# Patient Record
Sex: Female | Born: 1935 | Race: White | Hispanic: No | Marital: Married | Smoking: Never smoker
Health system: Southern US, Community
[De-identification: ages and names within clinical notes are randomized; demographics above are authoritative.]

## PROBLEM LIST (undated history)

## (undated) DIAGNOSIS — I10 Essential (primary) hypertension: Secondary | ICD-10-CM

## (undated) DIAGNOSIS — J45909 Unspecified asthma, uncomplicated: Secondary | ICD-10-CM

## (undated) DIAGNOSIS — K634 Enteroptosis: Secondary | ICD-10-CM

## (undated) DIAGNOSIS — M1990 Unspecified osteoarthritis, unspecified site: Secondary | ICD-10-CM

## (undated) DIAGNOSIS — I2699 Other pulmonary embolism without acute cor pulmonale: Secondary | ICD-10-CM

## (undated) DIAGNOSIS — J449 Chronic obstructive pulmonary disease, unspecified: Secondary | ICD-10-CM

## (undated) DIAGNOSIS — K921 Melena: Secondary | ICD-10-CM

## (undated) DIAGNOSIS — Z1211 Encounter for screening for malignant neoplasm of colon: Secondary | ICD-10-CM

## (undated) DIAGNOSIS — T7840XA Allergy, unspecified, initial encounter: Secondary | ICD-10-CM

## (undated) DIAGNOSIS — Z1239 Encounter for other screening for malignant neoplasm of breast: Secondary | ICD-10-CM

## (undated) DIAGNOSIS — Z86718 Personal history of other venous thrombosis and embolism: Secondary | ICD-10-CM

## (undated) DIAGNOSIS — Z8719 Personal history of other diseases of the digestive system: Secondary | ICD-10-CM

## (undated) HISTORY — DX: Encounter for other screening for malignant neoplasm of breast: Z12.39

## (undated) HISTORY — DX: Melena: K92.1

## (undated) HISTORY — DX: Encounter for screening for malignant neoplasm of colon: Z12.11

## (undated) HISTORY — DX: Personal history of other venous thrombosis and embolism: Z86.718

## (undated) HISTORY — DX: Allergy, unspecified, initial encounter: T78.40XA

## (undated) HISTORY — DX: Personal history of other diseases of the digestive system: Z87.19

## (undated) HISTORY — DX: Unspecified osteoarthritis, unspecified site: M19.90

## (undated) HISTORY — DX: Essential (primary) hypertension: I10

---
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
## 1963-10-05 HISTORY — PX: OTHER SURGICAL HISTORY: SHX169

## 1966-10-04 HISTORY — PX: ABDOMINAL HYSTERECTOMY: SHX81

## 1966-10-04 HISTORY — PX: TUBAL LIGATION: SHX77

## 1984-10-04 DIAGNOSIS — Z8719 Personal history of other diseases of the digestive system: Secondary | ICD-10-CM

## 1984-10-04 HISTORY — PX: OTHER SURGICAL HISTORY: SHX169

## 1984-10-04 HISTORY — DX: Personal history of other diseases of the digestive system: Z87.19

## 1995-10-05 DIAGNOSIS — I10 Essential (primary) hypertension: Secondary | ICD-10-CM

## 1995-10-05 HISTORY — DX: Essential (primary) hypertension: I10

## 1997-10-04 HISTORY — PX: CHOLECYSTECTOMY: SHX55

## 1998-10-04 HISTORY — PX: COLON SURGERY: SHX602

## 2002-10-04 DIAGNOSIS — Z86718 Personal history of other venous thrombosis and embolism: Secondary | ICD-10-CM

## 2002-10-04 HISTORY — DX: Personal history of other venous thrombosis and embolism: Z86.718

## 2003-11-20 ENCOUNTER — Inpatient Hospital Stay (HOSPITAL_COMMUNITY): Admission: EM | Admit: 2003-11-20 | Discharge: 2003-11-24 | Payer: Self-pay | Admitting: Emergency Medicine

## 2003-12-05 ENCOUNTER — Inpatient Hospital Stay (HOSPITAL_COMMUNITY): Admission: EM | Admit: 2003-12-05 | Discharge: 2003-12-08 | Payer: Self-pay

## 2004-07-22 ENCOUNTER — Ambulatory Visit: Payer: Self-pay | Admitting: Family Medicine

## 2004-07-28 ENCOUNTER — Emergency Department (HOSPITAL_COMMUNITY): Admission: EM | Admit: 2004-07-28 | Discharge: 2004-07-28 | Payer: Self-pay | Admitting: Emergency Medicine

## 2004-12-22 ENCOUNTER — Ambulatory Visit: Payer: Self-pay | Admitting: Podiatry

## 2005-02-22 ENCOUNTER — Ambulatory Visit: Payer: Self-pay | Admitting: Family Medicine

## 2005-03-15 ENCOUNTER — Ambulatory Visit: Payer: Self-pay

## 2005-12-31 ENCOUNTER — Ambulatory Visit: Payer: Self-pay | Admitting: Family Medicine

## 2006-02-02 ENCOUNTER — Emergency Department: Payer: Self-pay | Admitting: Emergency Medicine

## 2006-02-02 ENCOUNTER — Other Ambulatory Visit: Payer: Self-pay

## 2006-02-07 ENCOUNTER — Emergency Department (HOSPITAL_COMMUNITY): Admission: EM | Admit: 2006-02-07 | Discharge: 2006-02-07 | Payer: Self-pay | Admitting: Emergency Medicine

## 2006-02-09 ENCOUNTER — Encounter: Payer: Self-pay | Admitting: Obstetrics and Gynecology

## 2006-02-14 ENCOUNTER — Ambulatory Visit: Payer: Self-pay | Admitting: Family Medicine

## 2006-06-23 ENCOUNTER — Ambulatory Visit: Payer: Self-pay | Admitting: Family Medicine

## 2007-01-16 ENCOUNTER — Other Ambulatory Visit: Payer: Self-pay

## 2007-01-16 ENCOUNTER — Emergency Department: Payer: Self-pay | Admitting: General Practice

## 2007-01-24 ENCOUNTER — Ambulatory Visit: Payer: Self-pay | Admitting: Specialist

## 2008-03-23 ENCOUNTER — Other Ambulatory Visit: Payer: Self-pay

## 2008-03-23 ENCOUNTER — Inpatient Hospital Stay: Payer: Self-pay | Admitting: Internal Medicine

## 2008-05-15 ENCOUNTER — Ambulatory Visit: Payer: Self-pay | Admitting: Family Medicine

## 2008-06-14 ENCOUNTER — Emergency Department: Payer: Self-pay | Admitting: Emergency Medicine

## 2008-06-21 ENCOUNTER — Ambulatory Visit: Payer: Self-pay | Admitting: Internal Medicine

## 2008-06-28 ENCOUNTER — Ambulatory Visit: Payer: Self-pay | Admitting: Rheumatology

## 2008-08-28 ENCOUNTER — Emergency Department: Payer: Self-pay | Admitting: Emergency Medicine

## 2008-09-06 ENCOUNTER — Ambulatory Visit: Payer: Self-pay | Admitting: Rheumatology

## 2008-09-18 ENCOUNTER — Ambulatory Visit: Payer: Self-pay | Admitting: Rheumatology

## 2008-09-18 ENCOUNTER — Emergency Department: Payer: Self-pay | Admitting: Emergency Medicine

## 2008-12-03 ENCOUNTER — Inpatient Hospital Stay: Payer: Self-pay | Admitting: Internal Medicine

## 2009-02-28 ENCOUNTER — Emergency Department: Payer: Self-pay | Admitting: Emergency Medicine

## 2009-03-05 ENCOUNTER — Ambulatory Visit: Payer: Self-pay | Admitting: Family Medicine

## 2009-03-07 ENCOUNTER — Emergency Department: Payer: Self-pay | Admitting: Emergency Medicine

## 2009-03-10 ENCOUNTER — Inpatient Hospital Stay: Payer: Self-pay | Admitting: Internal Medicine

## 2009-05-16 ENCOUNTER — Ambulatory Visit: Payer: Self-pay

## 2009-08-11 ENCOUNTER — Emergency Department: Payer: Self-pay | Admitting: Emergency Medicine

## 2009-08-15 ENCOUNTER — Emergency Department: Payer: Self-pay | Admitting: Emergency Medicine

## 2010-01-02 ENCOUNTER — Ambulatory Visit: Payer: Self-pay | Admitting: Unknown Physician Specialty

## 2010-04-10 ENCOUNTER — Ambulatory Visit: Payer: Self-pay | Admitting: Family Medicine

## 2010-04-14 ENCOUNTER — Emergency Department: Payer: Self-pay | Admitting: Unknown Physician Specialty

## 2010-09-03 ENCOUNTER — Ambulatory Visit: Payer: Self-pay | Admitting: Family Medicine

## 2010-10-04 DIAGNOSIS — T7840XA Allergy, unspecified, initial encounter: Secondary | ICD-10-CM

## 2010-10-04 HISTORY — DX: Allergy, unspecified, initial encounter: T78.40XA

## 2010-12-25 ENCOUNTER — Ambulatory Visit: Payer: Self-pay | Admitting: General Surgery

## 2010-12-25 HISTORY — PX: COLONOSCOPY W/ POLYPECTOMY: SHX1380

## 2010-12-26 ENCOUNTER — Emergency Department: Payer: Self-pay | Admitting: Emergency Medicine

## 2010-12-28 LAB — PATHOLOGY REPORT

## 2011-03-11 ENCOUNTER — Observation Stay: Payer: Self-pay | Admitting: Internal Medicine

## 2011-10-11 ENCOUNTER — Ambulatory Visit: Payer: Self-pay | Admitting: Family Medicine

## 2012-05-15 ENCOUNTER — Ambulatory Visit: Payer: Self-pay | Admitting: Family Medicine

## 2012-05-23 ENCOUNTER — Emergency Department: Payer: Self-pay | Admitting: Emergency Medicine

## 2012-05-23 LAB — CBC
HCT: 36.2 % (ref 35.0–47.0)
HGB: 12.7 g/dL (ref 12.0–16.0)
MCH: 29.5 pg (ref 26.0–34.0)
MCHC: 35 g/dL (ref 32.0–36.0)
MCV: 84 fL (ref 80–100)
Platelet: 207 10*3/uL (ref 150–440)
RBC: 4.29 10*6/uL (ref 3.80–5.20)
RDW: 14.7 % — ABNORMAL HIGH (ref 11.5–14.5)
WBC: 8.7 10*3/uL (ref 3.6–11.0)

## 2012-05-23 LAB — COMPREHENSIVE METABOLIC PANEL
Albumin: 4 g/dL (ref 3.4–5.0)
Alkaline Phosphatase: 88 U/L (ref 50–136)
Anion Gap: 8 (ref 7–16)
BUN: 13 mg/dL (ref 7–18)
Bilirubin,Total: 0.3 mg/dL (ref 0.2–1.0)
Calcium, Total: 9.1 mg/dL (ref 8.5–10.1)
Chloride: 107 mmol/L (ref 98–107)
Co2: 27 mmol/L (ref 21–32)
Creatinine: 0.96 mg/dL (ref 0.60–1.30)
EGFR (African American): >60
EGFR (Non-African Amer.): 57 — ABNORMAL LOW
Glucose: 108 mg/dL — ABNORMAL HIGH (ref 65–99)
Osmolality: 284 (ref 275–301)
Potassium: 4 mmol/L (ref 3.5–5.1)
SGOT(AST): 25 U/L (ref 15–37)
SGPT (ALT): 16 U/L (ref 12–78)
Sodium: 142 mmol/L (ref 136–145)
Total Protein: 7.5 g/dL (ref 6.4–8.2)

## 2012-05-23 LAB — CK TOTAL AND CKMB (NOT AT ARMC)
CK, Total: 59 U/L (ref 21–215)
CK-MB: 0.9 ng/mL (ref 0.5–3.6)

## 2012-05-23 LAB — PRO B NATRIURETIC PEPTIDE: B-Type Natriuretic Peptide: 209 pg/mL (ref 0–450)

## 2012-05-23 LAB — TROPONIN I: Troponin-I: <0.02 ng/mL

## 2012-06-20 ENCOUNTER — Ambulatory Visit: Payer: Self-pay | Admitting: Family Medicine

## 2012-06-21 ENCOUNTER — Ambulatory Visit: Payer: Self-pay | Admitting: Family Medicine

## 2013-04-02 ENCOUNTER — Emergency Department: Payer: Self-pay | Admitting: Emergency Medicine

## 2013-04-02 LAB — CBC
HCT: 37.2 % (ref 35.0–47.0)
HGB: 12.9 g/dL (ref 12.0–16.0)
MCH: 28.6 pg (ref 26.0–34.0)
MCHC: 34.6 g/dL (ref 32.0–36.0)
MCV: 83 fL (ref 80–100)
Platelet: 180 10*3/uL (ref 150–440)
RBC: 4.51 10*6/uL (ref 3.80–5.20)
RDW: 14.7 % — ABNORMAL HIGH (ref 11.5–14.5)
WBC: 8.1 10*3/uL (ref 3.6–11.0)

## 2013-04-02 LAB — COMPREHENSIVE METABOLIC PANEL
Albumin: 4 g/dL (ref 3.4–5.0)
Alkaline Phosphatase: 79 U/L (ref 50–136)
Anion Gap: 7 (ref 7–16)
BUN: 13 mg/dL (ref 7–18)
Bilirubin,Total: 0.5 mg/dL (ref 0.2–1.0)
Calcium, Total: 9.1 mg/dL (ref 8.5–10.1)
Chloride: 107 mmol/L (ref 98–107)
Co2: 27 mmol/L (ref 21–32)
Creatinine: 0.97 mg/dL (ref 0.60–1.30)
EGFR (African American): >60
EGFR (Non-African Amer.): 56 — ABNORMAL LOW
Glucose: 122 mg/dL — ABNORMAL HIGH (ref 65–99)
Osmolality: 283 (ref 275–301)
Potassium: 3.5 mmol/L (ref 3.5–5.1)
SGOT(AST): 19 U/L (ref 15–37)
SGPT (ALT): 17 U/L (ref 12–78)
Sodium: 141 mmol/L (ref 136–145)
Total Protein: 7.5 g/dL (ref 6.4–8.2)

## 2013-04-02 LAB — PRO B NATRIURETIC PEPTIDE: B-Type Natriuretic Peptide: 306 pg/mL (ref 0–450)

## 2013-04-02 LAB — TROPONIN I: Troponin-I: <0.02 ng/mL

## 2013-04-18 ENCOUNTER — Encounter: Payer: Self-pay | Admitting: *Deleted

## 2013-04-18 DIAGNOSIS — Z86718 Personal history of other venous thrombosis and embolism: Secondary | ICD-10-CM | POA: Insufficient documentation

## 2013-07-03 ENCOUNTER — Emergency Department: Payer: Self-pay

## 2013-07-03 LAB — BASIC METABOLIC PANEL
Anion Gap: 5 — ABNORMAL LOW (ref 7–16)
BUN: 15 mg/dL (ref 7–18)
Calcium, Total: 9.8 mg/dL (ref 8.5–10.1)
Chloride: 104 mmol/L (ref 98–107)
Co2: 27 mmol/L (ref 21–32)
Creatinine: 0.84 mg/dL (ref 0.60–1.30)
EGFR (African American): >60
EGFR (Non-African Amer.): >60
Glucose: 105 mg/dL — ABNORMAL HIGH (ref 65–99)
Osmolality: 273 (ref 275–301)
Potassium: 4.2 mmol/L (ref 3.5–5.1)
Sodium: 136 mmol/L (ref 136–145)

## 2013-07-03 LAB — TROPONIN I
Troponin-I: <0.02 ng/mL
Troponin-I: <0.02 ng/mL

## 2013-07-03 LAB — CBC
HCT: 37.4 % (ref 35.0–47.0)
HGB: 13.1 g/dL (ref 12.0–16.0)
MCH: 28.4 pg (ref 26.0–34.0)
MCHC: 34.9 g/dL (ref 32.0–36.0)
MCV: 81 fL (ref 80–100)
Platelet: 217 10*3/uL (ref 150–440)
RBC: 4.59 10*6/uL (ref 3.80–5.20)
RDW: 14.7 % — ABNORMAL HIGH (ref 11.5–14.5)
WBC: 9.7 10*3/uL (ref 3.6–11.0)

## 2013-07-06 ENCOUNTER — Observation Stay: Payer: Self-pay | Admitting: Internal Medicine

## 2013-07-06 LAB — BASIC METABOLIC PANEL
Anion Gap: 9 (ref 7–16)
BUN: 12 mg/dL (ref 7–18)
Calcium, Total: 9 mg/dL (ref 8.5–10.1)
Chloride: 108 mmol/L — ABNORMAL HIGH (ref 98–107)
Co2: 22 mmol/L (ref 21–32)
Creatinine: 0.92 mg/dL (ref 0.60–1.30)
EGFR (African American): >60
EGFR (Non-African Amer.): 60 — ABNORMAL LOW
Glucose: 112 mg/dL — ABNORMAL HIGH (ref 65–99)
Osmolality: 278 (ref 275–301)
Potassium: 3.8 mmol/L (ref 3.5–5.1)
Sodium: 139 mmol/L (ref 136–145)

## 2013-07-06 LAB — CBC WITH DIFFERENTIAL/PLATELET
Basophil #: 0.1 10*3/uL (ref 0.0–0.1)
Basophil %: 0.7 %
Eosinophil #: 0.1 10*3/uL (ref 0.0–0.7)
Eosinophil %: 0.9 %
HCT: 39 % (ref 35.0–47.0)
HGB: 13.5 g/dL (ref 12.0–16.0)
Lymphocyte #: 1.5 10*3/uL (ref 1.0–3.6)
Lymphocyte %: 14.1 %
MCH: 28.5 pg (ref 26.0–34.0)
MCHC: 34.5 g/dL (ref 32.0–36.0)
MCV: 83 fL (ref 80–100)
Monocyte #: 0.6 x10 3/mm (ref 0.2–0.9)
Monocyte %: 5.8 %
Neutrophil #: 8.6 10*3/uL — ABNORMAL HIGH (ref 1.4–6.5)
Neutrophil %: 78.5 %
Platelet: 202 10*3/uL (ref 150–440)
RBC: 4.72 10*6/uL (ref 3.80–5.20)
RDW: 14.4 % (ref 11.5–14.5)
WBC: 10.9 10*3/uL (ref 3.6–11.0)

## 2013-07-07 LAB — CBC WITH DIFFERENTIAL/PLATELET
Basophil #: 0 10*3/uL (ref 0.0–0.1)
Basophil %: 0.2 %
Eosinophil #: 0 10*3/uL (ref 0.0–0.7)
Eosinophil %: 0 %
HCT: 32.8 % — ABNORMAL LOW (ref 35.0–47.0)
HGB: 11.6 g/dL — ABNORMAL LOW (ref 12.0–16.0)
Lymphocyte #: 0.8 10*3/uL — ABNORMAL LOW (ref 1.0–3.6)
Lymphocyte %: 8.6 %
MCH: 29 pg (ref 26.0–34.0)
MCHC: 35.5 g/dL (ref 32.0–36.0)
MCV: 82 fL (ref 80–100)
Monocyte #: 0.1 x10 3/mm — ABNORMAL LOW (ref 0.2–0.9)
Monocyte %: 1.6 %
Neutrophil #: 8.3 10*3/uL — ABNORMAL HIGH (ref 1.4–6.5)
Neutrophil %: 89.6 %
Platelet: 209 10*3/uL (ref 150–440)
RBC: 4.01 10*6/uL (ref 3.80–5.20)
RDW: 14.6 % — ABNORMAL HIGH (ref 11.5–14.5)
WBC: 9.3 10*3/uL (ref 3.6–11.0)

## 2013-07-07 LAB — BASIC METABOLIC PANEL
Anion Gap: 7 (ref 7–16)
BUN: 13 mg/dL (ref 7–18)
Calcium, Total: 9.1 mg/dL (ref 8.5–10.1)
Chloride: 110 mmol/L — ABNORMAL HIGH (ref 98–107)
Co2: 24 mmol/L (ref 21–32)
Creatinine: 0.96 mg/dL (ref 0.60–1.30)
EGFR (African American): >60
EGFR (Non-African Amer.): 57 — ABNORMAL LOW
Glucose: 160 mg/dL — ABNORMAL HIGH (ref 65–99)
Osmolality: 285 (ref 275–301)
Potassium: 3.8 mmol/L (ref 3.5–5.1)
Sodium: 141 mmol/L (ref 136–145)

## 2013-10-02 ENCOUNTER — Encounter: Payer: Self-pay | Admitting: Family Medicine

## 2013-10-04 ENCOUNTER — Encounter: Payer: Self-pay | Admitting: Family Medicine

## 2013-10-29 ENCOUNTER — Ambulatory Visit (INDEPENDENT_AMBULATORY_CARE_PROVIDER_SITE_OTHER): Payer: Medicare HMO | Admitting: Emergency Medicine

## 2013-10-29 ENCOUNTER — Encounter: Payer: Self-pay | Admitting: Emergency Medicine

## 2013-10-29 VITALS — BP 164/90 | HR 73 | Temp 98.2°F | Ht 66.0 in | Wt 150.0 lb

## 2013-10-29 DIAGNOSIS — R0609 Other forms of dyspnea: Secondary | ICD-10-CM

## 2013-10-29 DIAGNOSIS — R0989 Other specified symptoms and signs involving the circulatory and respiratory systems: Secondary | ICD-10-CM

## 2013-10-29 DIAGNOSIS — R0600 Dyspnea, unspecified: Secondary | ICD-10-CM | POA: Insufficient documentation

## 2013-10-29 NOTE — Patient Instructions (Signed)
Walking oximetry today We will perform full pulmonary function testing  Follow with Dr Delton CoombesByrum next available with Full PFT to review

## 2013-10-29 NOTE — Progress Notes (Signed)
Subjective:    Patient ID: Brooke Perez, female    DOB: 09-Jun-1933, 80 y.o.   MRN: [id number]  HPI 80 yo woman, never smoker, hx allergies, HTN, pulmonary embolism dx in '04 at [facility] treated w coumadin, but no longer tolerated. She had a low prob V/q in 2012. She has documented nocturnal hypoxemia. She was given dx of asthma over 20 yrs ago, but office notes from Dr Eliane Decree office suggest decreased FEV1 without obstruction.   She was dx with PNA 9 months ago> bronchitic sx, SOB at all times. She was admitted to [facility] in 10/14 for an apparent bronchitis + dyspnea. Was rx with steroids, ? abx - she isn't sure. She is doing [facility] at [employer]   Her biggest complaint is exertional dyspnea.     Review of Systems  Constitutional: Negative for fever, chills and unexpected weight change.  HENT: Positive for dental problem, sneezing and sore throat. Negative for congestion, ear pain, nosebleeds, postnasal drip, rhinorrhea, sinus pressure, trouble swallowing and voice change.   Eyes: Negative for visual disturbance.  Respiratory: Positive for cough and shortness of breath. Negative for choking.   Cardiovascular: Positive for chest pain. Negative for leg swelling.  Gastrointestinal: Positive for abdominal pain. Negative for vomiting and diarrhea.  Genitourinary: Negative for difficulty urinating.       Indigestion  Musculoskeletal: Positive for arthralgias.  Skin: Negative for rash.  Neurological: Negative for tremors, syncope and headaches.  Hematological: Does not bruise/bleed easily.    Past Medical History  Diagnosis Date  . Allergy 2012    ? benzimidazole, IV dye  . Arthritis     since 1997  . Hernia 2011  . Hypertension 1997    since 1997  . Blood in stool   . Breast screening, unspecified   . Special screening for malignant neoplasms, colon   . History of cholecystitis 1986  . H/O blood clots 2004     Family History  Problem Relation Age of Onset  . Emphysema  Sister     smoked  . Emphysema Sister     smoked  . Asthma Sister   . Asthma Brother   . Heart disease Mother   . Heart disease Maternal Uncle   . Heart disease Brother      History   Social History  . Marital Status: Married    Spouse Name: N/A    Number of Children: 2  . Years of Education: N/A   Occupational History  . 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 Smoking status: Never Smoker   . Smokeless tobacco: Never Used  . Alcohol Use: No  . Drug Use: No  . Sexual Activity: Not on file   Other Topics Concern  . Not on file   Social History Narrative  . No narrative on file     Allergies  Allergen Reactions  . Alprazolam   . Aspirin   . Atenolol   . Atrovent [Ipratropium]   . Ciprofloxacin   . Clonidine Derivatives   . Codeine   . Contrast Media [Iodinated Diagnostic Agents]   . Coumadin [Warfarin Sodium]   . Darvon [Propoxyphene Hcl]   . Demerol [Meperidine]   . Flovent [Fluticasone]   . Guiatuss [Guaifenesin]   . Iohexol   . Keflex [Cephalexin]   . Morphine And Related   . Nitroglycerin   . Parafon Forte Dsc [  Chlorzoxazone]   . Plavix [Clopidogrel Bisulfate]   . Prednisone (Pak) [Prednisone]   . Pyrimethamine   . Sulfa Antibiotics   . Zithromax [Azithromycin]      No outpatient prescriptions prior to visit.   No facility-administered medications prior to visit.          Objective:   Physical Exam Filed Vitals:   10/29/13 1404  BP: 164/90  Pulse: 73  Temp: 98.2 F (36.8 C)  TempSrc: Oral  Height: 5\' 6"  (1.676 m)  Weight: 150 lb (68.04 kg)  SpO2: 95%   Gen: Pleasant, well-nourished, in no distress, strange affect  ENT: No lesions,  mouth clear,  oropharynx clear, no postnasal drip  Neck: No JVD, no TMG, no carotid bruits, some soft insp stridor  Lungs: No use of accessory muscles, clear without rales or rhonchi, no wheezes  Cardiovascular: RRR, heart sounds normal, no  murmur or gallops, no peripheral edema  Musculoskeletal: No deformities, no cyanosis or clubbing  Neuro: alert, non focal  Skin: Warm, no lesions or rashes      Assessment & Plan:  Dyspnea Resting and exertional SOB in pt with hx of remote VTE, possible asthma (she states dx > 20 yrs ago). Etiology is unclear. She is a never smoker. Has not always benefited from albuterol clinically. A V/q in 2012 was low prob. Story sounds like bronchitic, bronchospastic sx. She has [facility]  - full PFT's - walk today - obtained CXR from [facility] - continue pulm rehab - rov to discuss PFT

## 2013-10-29 NOTE — Assessment & Plan Note (Addendum)
Resting and exertional SOB in pt with hx of remote VTE, possible asthma (she states dx > 20 yrs ago). Etiology is unclear. She is a never smoker. Has not always benefited from albuterol clinically. A V/q in 2012 was low prob. Story sounds like bronchitic, bronchospastic sx. She has [facility].  - full PFT's - walk today - obtained CXR from [facility] - continue pulm rehab - rov to discuss PFT

## 2013-11-04 ENCOUNTER — Encounter: Payer: Self-pay | Admitting: Family Medicine

## 2013-11-07 ENCOUNTER — Ambulatory Visit: Payer: Medicare HMO | Admitting: Emergency Medicine

## 2013-11-07 ENCOUNTER — Encounter (HOSPITAL_COMMUNITY): Payer: Medicare HMO

## 2013-12-02 ENCOUNTER — Encounter: Payer: Self-pay | Admitting: Family Medicine

## 2014-01-02 ENCOUNTER — Encounter: Payer: Self-pay | Admitting: Family Medicine

## 2014-01-11 ENCOUNTER — Emergency Department: Payer: Self-pay | Admitting: Emergency Medicine

## 2014-01-11 LAB — CBC
HCT: 40 % (ref 35.0–47.0)
HGB: 12.9 g/dL (ref 12.0–16.0)
MCH: 26.7 pg (ref 26.0–34.0)
MCHC: 32.2 g/dL (ref 32.0–36.0)
MCV: 83 fL (ref 80–100)
Platelet: 213 10*3/uL (ref 150–440)
RBC: 4.82 10*6/uL (ref 3.80–5.20)
RDW: 14.7 % — ABNORMAL HIGH (ref 11.5–14.5)
WBC: 9.3 10*3/uL (ref 3.6–11.0)

## 2014-01-11 LAB — COMPREHENSIVE METABOLIC PANEL
Albumin: 4.2 g/dL (ref 3.4–5.0)
Alkaline Phosphatase: 83 U/L
Anion Gap: 4 — ABNORMAL LOW (ref 7–16)
BUN: 13 mg/dL (ref 7–18)
Bilirubin,Total: 0.4 mg/dL (ref 0.2–1.0)
Calcium, Total: 9.4 mg/dL (ref 8.5–10.1)
Chloride: 106 mmol/L (ref 98–107)
Co2: 28 mmol/L (ref 21–32)
Creatinine: 0.83 mg/dL (ref 0.60–1.30)
EGFR (African American): >60
EGFR (Non-African Amer.): >60
Glucose: 115 mg/dL — ABNORMAL HIGH (ref 65–99)
Osmolality: 277 (ref 275–301)
Potassium: 3.5 mmol/L (ref 3.5–5.1)
SGOT(AST): 25 U/L (ref 15–37)
SGPT (ALT): 19 U/L (ref 12–78)
Sodium: 138 mmol/L (ref 136–145)
Total Protein: 7.7 g/dL (ref 6.4–8.2)

## 2014-01-11 LAB — URINALYSIS, COMPLETE
Bacteria: NONE SEEN
Bilirubin,UR: NEGATIVE
Glucose,UR: NEGATIVE mg/dL (ref 0–75)
Ketone: NEGATIVE
Leukocyte Esterase: NEGATIVE
Nitrite: NEGATIVE
Ph: 7 (ref 4.5–8.0)
Protein: NEGATIVE
RBC,UR: NONE SEEN /HPF (ref 0–5)
Specific Gravity: 1.003 (ref 1.003–1.030)
Squamous Epithelial: <1
WBC UR: <1 /HPF (ref 0–5)

## 2014-01-11 LAB — TROPONIN I: Troponin-I: <0.02 ng/mL

## 2014-01-11 LAB — CK TOTAL AND CKMB (NOT AT ARMC)
CK, Total: 44 U/L
CK-MB: 0.9 ng/mL (ref 0.5–3.6)

## 2014-02-01 ENCOUNTER — Encounter: Payer: Self-pay | Admitting: Family Medicine

## 2014-03-04 ENCOUNTER — Encounter: Payer: Self-pay | Admitting: Family Medicine

## 2014-04-17 ENCOUNTER — Ambulatory Visit (INDEPENDENT_AMBULATORY_CARE_PROVIDER_SITE_OTHER): Payer: Medicare HMO | Admitting: Emergency Medicine

## 2014-04-17 ENCOUNTER — Ambulatory Visit (INDEPENDENT_AMBULATORY_CARE_PROVIDER_SITE_OTHER)
Admission: RE | Admit: 2014-04-17 | Discharge: 2014-04-17 | Disposition: A | Discharge status: Home / Self Care | Payer: Medicare HMO | Source: Ambulatory Visit | Attending: Emergency Medicine | Admitting: Emergency Medicine

## 2014-04-17 ENCOUNTER — Encounter: Payer: Self-pay | Admitting: Emergency Medicine

## 2014-04-17 VITALS — BP 130/86 | HR 82 | Ht 66.0 in | Wt 150.6 lb

## 2014-04-17 DIAGNOSIS — R0600 Dyspnea, unspecified: Secondary | ICD-10-CM

## 2014-04-17 DIAGNOSIS — R0989 Other specified symptoms and signs involving the circulatory and respiratory systems: Secondary | ICD-10-CM

## 2014-04-17 DIAGNOSIS — R0609 Other forms of dyspnea: Secondary | ICD-10-CM

## 2014-04-17 MED ORDER — FLUTICASONE FUROATE-VILANTEROL 100-25 MCG/INH IN AEPB: 1.0000 | INHALATION_SPRAY | Freq: Every day | RESPIRATORY_TRACT | Status: DC

## 2014-04-17 MED ORDER — HYDROCOD POLST-CHLORPHEN POLST 10-8 MG/5ML PO LQCR: 5.0000 mL | Freq: Two times a day (BID) | ORAL | Status: DC | PRN

## 2014-04-17 NOTE — Assessment & Plan Note (Addendum)
Spirometry today > consistent with mild to moderate AFL - trial breo - tussionex for cough if she can tolerate - rov 6 weeks

## 2014-04-17 NOTE — Patient Instructions (Addendum)
We will repeat your CXR today Spirometry breathing testing today shows evidence for mild to moderate asthma.  We will start Breo once a day for the next 6 weeks Follow with Dr Delton CoombesByrum in 6 weeks to review your symptoms on the new medication.  Use tusionex 5cc every 12 hours if needed for cough Follow with Dr Delton CoombesByrum in 6 weeks or sooner if you have any problems

## 2014-04-17 NOTE — Progress Notes (Signed)
Subjective:    Patient ID: Brooke Perez, female    DOB: July 02, 1933, 80 y.o.   MRN: [id number]  Shortness of Breath Associated symptoms include abdominal pain, chest pain and a sore throat. Pertinent negatives include no ear pain, fever, headaches, leg swelling, rash, rhinorrhea or vomiting.   80 yo woman, never smoker, hx allergies, HTN, pulmonary embolism dx in '04 at [facility] treated w coumadin, but no longer tolerated. She had a low prob V/q in 2012. She has documented nocturnal hypoxemia. She was given dx of asthma over 20 yrs ago, but office notes from Dr Eliane Decree office suggest decreased FEV1 without obstruction.   She was dx with PNA 9 months ago> bronchitic sx, SOB at all times. She was admitted to [facility] in 10/14 for an apparent bronchitis + dyspnea. Was rx with steroids, ? abx - she isn't sure. She is doing [facility] at [employer]   Her biggest complaint is exertional dyspnea.   ROV 04/17/14 -- follow up for exertional SOB as well as cough. She has been unable to get her full PFT yet due to transportation issues. No desaturation after 2 laps last visit on RA. She describes today cough, sometimes productive. She has been using albuterol on a schedule since she was admitted to [facility] 10/'14. She is having LE edema. Hoarse voice, nasal gtt and congestion.      Review of Systems  Constitutional: Negative for fever, chills and unexpected weight change.  HENT: Positive for dental problem, sneezing and sore throat. Negative for congestion, ear pain, nosebleeds, postnasal drip, rhinorrhea, sinus pressure, trouble swallowing and voice change.   Eyes: Negative for visual disturbance.  Respiratory: Positive for cough and shortness of breath. Negative for choking.   Cardiovascular: Positive for chest pain. Negative for leg swelling.  Gastrointestinal: Positive for abdominal pain. Negative for vomiting and diarrhea.  Genitourinary: Negative for difficulty urinating.       Indigestion   Musculoskeletal: Positive for arthralgias.  Skin: Negative for rash.  Neurological: Negative for tremors, syncope and headaches.  Hematological: Does not bruise/bleed easily.     Family History  Problem Relation Age of Onset  . Emphysema Sister     smoked  . Emphysema Sister     smoked  . Asthma Sister   . Asthma Brother   . Heart disease Mother   . Heart disease Maternal Uncle   . Heart disease Brother      History   Social History  . Marital Status: Married    Spouse Name: N/A    Number of Children: 2  . Years of Education: N/A   Occupational History  . 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 Smoking status: Never Smoker   . Smokeless tobacco: Never Used  . Alcohol Use: No  . Drug Use: No  . Sexual Activity: Not on file   Other Topics Concern  . Not on file   Social History Narrative  . No narrative on file     Allergies  Allergen Reactions  . Alprazolam   . Aspirin   . Atenolol   . Atrovent [Ipratropium]   . Ciprofloxacin   . Clonidine Derivatives   . Codeine   . Contrast Media [Iodinated Diagnostic Agents]   . Coumadin [Warfarin Sodium]   . Darvon [Propoxyphene Hcl]   . Demerol [Meperidine]   . Flovent [Fluticasone]   . Guiatuss [Guaifenesin]   .  Iohexol   . Keflex [Cephalexin]   . Morphine And Related   . Nitroglycerin   . Parafon Forte Dsc [Chlorzoxazone]   . Plavix [Clopidogrel Bisulfate]   . Prednisone (Pak) [Prednisone]   . Pyrimethamine   . Sulfa Antibiotics   . Zithromax [Azithromycin]      Outpatient Prescriptions Prior to Visit  Medication Sig Dispense Refill  . albuterol (PROVENTIL) (2.5 MG/3ML) 0.083% nebulizer solution Take 2.5 mg by nebulization every 6 (six) hours as needed for wheezing or shortness of breath.      Marland Kitchen. albuterol (VENTOLIN HFA) 108 (90 BASE) MCG/ACT inhaler Inhale 2 puffs into the lungs every 6 (six) hours as needed for wheezing or shortness of breath.       . levothyroxine (SYNTHROID, LEVOTHROID) 75 MCG tablet Take 75 mcg by mouth daily before breakfast.      . Polyethylene Glycol 3350 (MIRALAX PO) Take 1 Package by mouth daily.       No facility-administered medications prior to visit.          Objective:   Physical Exam Filed Vitals:   04/17/14 1325  BP: 130/86  Pulse: 82  Height: 5\' 6"  (1.676 m)  Weight: 150 lb 9.6 oz (68.312 kg)  SpO2: 98%   Gen: Pleasant, well-nourished, in no distress, strange affect, occasionally coughing  ENT: No lesions,  mouth clear,  oropharynx clear, no postnasal drip, hoarse voice  Neck: No JVD, no TMG, no carotid bruits, some soft insp stridor  Lungs: No use of accessory muscles, clear without rales or rhonchi, no wheezes  Cardiovascular: RRR, heart sounds normal, no murmur or gallops, trace peripheral edema  Musculoskeletal: No deformities, no cyanosis or clubbing  Neuro: alert, non focal  Skin: Warm, no lesions or rashes      Assessment & Plan:  Dyspnea Spirometry today > consistent with mild to moderate AFL - trial breo - tussionex for cough if she can tolerate - rov 6 weeks

## 2014-05-29 ENCOUNTER — Ambulatory Visit: Payer: Medicare HMO | Admitting: Emergency Medicine

## 2014-08-05 ENCOUNTER — Encounter: Payer: Self-pay | Admitting: Emergency Medicine

## 2014-08-08 ENCOUNTER — Ambulatory Visit: Payer: Self-pay | Admitting: Family Medicine

## 2014-09-09 DIAGNOSIS — N993 Prolapse of vaginal vault after hysterectomy: Secondary | ICD-10-CM | POA: Insufficient documentation

## 2014-10-30 DIAGNOSIS — N1330 Unspecified hydronephrosis: Secondary | ICD-10-CM | POA: Insufficient documentation

## 2014-10-30 DIAGNOSIS — R339 Retention of urine, unspecified: Secondary | ICD-10-CM | POA: Insufficient documentation

## 2015-01-24 NOTE — H&P (Signed)
PATIENT NAME:  Brooke Perez, [name]:  [id number] DATE OF BIRTH:  [birth year]  DATE OF ADMISSION:  07/06/2013  PRIMARY CARE PROVIDER: Hillery AldoSarah Heavyn Yearsley, MD  ED REFERRING PHYSICIAN: Chaney Mallingavid Yao, MD  CHIEF COMPLAINT: Shortness of breath.   HISTORY OF PRESENT ILLNESS: The patient is a 81 year old with history of COPD, asthma and chronic bronchitis who has had progressive shortness of breath for the past few weeks. She has been treated with outpatient regimen with antibiotics however has not improved. Was seen by her primary care provider and she states that prior to going to her office. She was so severely short of breath that she almost collapsed. She was seen there and then referred to the ED for further evaluation. She reports that she has had chills, has had productive cough and significant amount of wheezing. She is taking oxygen only at nighttime. She otherwise reports cough which is nonproductive and has not had any fevers or chills. Denies any abdominal pain, nausea, vomiting or diarrhea. Denies any urinary frequency, urgency or hesitancy.   PAST MEDICAL HISTORY:  1.  Pneumonia in the past.  2.  Dermatitis.  3.  UTI. 4.  Anxiety and depression.  5.  Uterovaginal prolapse.  6.  Bilateral decreased hearing.  7.  COPD. 8.  Recurrent bladder spasm.  9.  Multiple drug allergies.  10.  Lactose intolerance.  11.  Previous history of pulmonary embolism.  12.  GERD. 13.  Hyperlipidemia.  14.  Hypothyroidism.  15.  Hypertension.   CURRENT MEDICATIONS:  1.  Ventolin 2 puffs q. 4 as needed. 2.  Albuterol neb q. 4 hours as needed. 3.  Norvasc 5 mg daily. 4.  Ampicillin 500 mg 1 tab p.o. b.i.d. 5.  MiraLax 17 grams 3 times a week. 6.  Synthroid 75 mcg daily. 7.  Ventolin 2 puffs q. 4 p.r.n.   SOCIAL HISTORY: Denies any smoking, alcohol or drug use.   ALLERGIES: TO MULTIPLE MEDICATIONS. ACCORDING TO HER, ALLERGIC REACTION TO THESE ALL WERE CRITICAL. THESE INCLUDE ALPRAZOLAM, ASPIRIN, ATENOLOL,  ATROVENT, CIPRO, CLONIDINE, CODEINE, CONTACT COLD, CORICIDIN COUGH DROPS, COUMADIN, DARVON, DEMER ceclor, eryc, GUIATUSS-DM, PARAFON FORTE, PLAVIX, PREDNISONE, PYRIDIUM, SULFA DRUGS AND ZITHROMAX.   SOCIAL HISTORY: Does not smoke. Does not drink. No drugs.   REVIEW OF SYSTEMS:   CONSTITUTIONAL: Denies any fevers. Complains of fatigue, weakness. No weight loss. No weight gain.  EYES: No blurred or double vision. No pain. No redness. No inflammation. No glaucoma or cataracts.  ENT: No tinnitus. No ear pain. Does have apparently bilateral hearing loss. No difficulty swallowing.  RESPIRATORY: Complains of cough, wheezing. No hemoptysis. Has COPD. CARDIOVASCULAR: Complains of bilateral chest pain with coughing. No orthopnea. No edema. No arrhythmia. Complains of dyspnea on exertion.  GASTROINTESTINAL: Complains of dry heaves currently. She states this is due to her reflux. Has a history of GERD. No IBS. No jaundice. No rectal bleeding.  GENITOURINARY: Denies any dysuria, hematuria, renal calculus or frequency.  ENDOCRINE: Denies any polyuria or nocturia. Does have hypothyroidism.  HEMATOLOGIC AND LYMPHATIC: Denies anemia, easy bruisability or bleeding.  SKIN: No acne. No rash. No changes in mole, hair or skin.  MUSCULOSKELETAL: Denies any pain in the neck, back or shoulder.  NEUROLOGIC: No numbness. No CVA, TIA or seizures.  PSYCHIATRIC: Does have a history of anxiety.   PHYSICAL EXAMINATION: VITAL SIGNS: Temperature 97.8, pulse 79, respirations 24, blood pressure 137/75, O2 95% on 2 liters.  GENERAL: The patient is a frail Caucasian female in mild respiratory distress.  HEENT: Head atraumatic, normocephalic. Pupils equally round and reactive to light and accommodation. There is no conjunctival pallor. No scleral icterus. Nasal exam shows no drainage or ulceration. Oropharynx is clear without any exudate.  NECK: Supple without any JVD. HEART: Regular rate and rhythm. No murmurs, rubs, clicks or  gallops.  LUNGS: She has bilateral wheezing throughout both lungs. There is some accessory muscle usage.  ABDOMEN: Soft, nontender, nondistended. Positive bowel sounds x 4. No hepatosplenomegaly.  EXTREMITIES: No clubbing, cyanosis or edema.  SKIN: No rash.  LYMPHATICS: No lymph nodes palpable. VASCULAR: Good DP and PT pulses.  PSYCHIATRIC: Is a little anxious.  NEUROLOGIC: Focal deficits.  LYMPHATICS: No lymph nodes palpable.   EVALUATIONS: WBC 10.9, hemoglobin 13.5, platelet count 202. Glucose 112, BUN 12, creatinine 0.92, sodium 139, potassium 3.8, chloride 108, CO2 22.  PA and lateral chest x-ray shows findings consistent with COPD.   Troponin less than 0.02. WBC 9.7, hemoglobin 13.1, platelet count 207.   ASSESSMENT AND PLAN: The patient is a 81 year old with history of chronic obstructive pulmonary disease, asthma and chronic bronchitis who presents with progressive shortness of breath, cough and abnormal d-dimer in primary care's office.  1.  Acute respiratory failure, likely due to chronic obstructive pulmonary disease/asthma exacerbation. The patient has been given IV methylprednisolone in the ED and has done okay, which I will continue. We will also place the patient on p.o. amoxicillin. Unable to take azithromycin, Levaquin or ceftriaxone. We will also give her nebulizers, only albuterol since her Atrovent allergy. Will also get a VQ scan in light of elevated d-dimer.  3.  Gastroesophageal reflux disease. Will continue lansoprazole. 4.  Hypothyroidism. Continue Synthroid.  5.  Hypertension. Continue Norvasc as taking at home.  6.  Miscellaneous. We will do Lovenox for deep vein thrombosis prophylaxis.  TIME SPENT: 45 minutes. ____________________________ Lacie Scotts Allena Katz, MD shp:sb D: 07/06/2013 15:01:59 ET T: 07/06/2013 15:15:18 ET JOB#: [id number]  cc: Fortunata Betty H. Allena Katz, MD, <Dictator> Charise Carwin MD ELECTRONICALLY SIGNED 07/20/2013 18:26

## 2015-01-24 NOTE — Discharge Summary (Signed)
PATIENT NAME:  Brooke Perez, Renate MR#:  [id number] DATE OF BIRTH:  23-Feb-1933  DATE OF ADMISSION:  07/06/2013 DATE OF DISCHARGE:  07/09/2013  DISCHARGE DIAGNOSES:  1.  Chronic obstructive pulmonary disease exacerbation. 2.  Gastroesophageal reflux disease. 3.  Hypothyroidism.  4.  Hypertension.  5.  Anxiety. 6.  MULTIPLE DRUG ALLERGIES.  DISCHARGE MEDICATIONS: Albuterol nebulizer every 4 hours as needed for wheezing, Synthroid 75 mcg p.o. daily, Xanax as needed for constipation, amlodipine 2.5 mg p.o. daily, ampicillin 250 mg t.i.d. for 10 days.   ALLERGIES: THE PATIENT'S ALLERGY LIST IS IN THE COMPUTER; PLEASE REFER TO IT.  THE PATIENT HAS ALLERGIES  TO ASPIRIN, CODEINE, COUMADIN, DARVON, CIPRO, KEFLEX, ATIVAN, PYRIDIUM, CLARITIN,  ZITHROMAX, COUGH DROPS, CORTISONE, STEROIDS, PREDNISONE, PLAVIX, CLONIDINE, , NITROGLYCERIN, CONTAC COLD, AND SHE IS ALLERGIC TO  GUAIFENESIN.  CONSULTATIONS: None.   HOSPITAL COURSE: The patient is a 81 year old female patient with a history of anxiety and history of asthma, came in because of a shortness of breath and productive phlegm. The patient was given amoxicillin and inhalers, with no relief. Came to emergency room because of pain symptoms. The patient's lab data on admission nonsignificant for any abnormality on CBC and Chem-7. Troponins have been negative.she hs multiple drug allergies A chest x-ray showed old COPD, with no pneumonia. The patient has some fibrosis. The patient received . Ampicillin was given, and she was also started on Solu-Medrol. Following this, she im\roved a lot. Her symptoms really got better. She also had a V/Q scan which did not show any pulmonary emboli. The patient really wanted to go home.  Her lungs are clear today. She says she cannot take p.o. prednisone because of severe throat swelling and feels like she is going to choke, so I did not give any prednisone. While she was here, she received Solu-Medrol 40 mg every 8 hours from  October 3 to 5, . The patient's Solu-Medrol changed to 40 every 12, yesterday so she did get tapering  and I did not discharge her with p.o. prednisone because of her allergic reaction history. The patient discharged only with amoxicillin and also albuterol inhalers.   For her blood pressure, she is on amlodipine. Oxygen saturation at rest on room air 94%, blood pressure 174/90 at the time of discharge, pulse 97, temperature 97.4.    PRIMARY CARE PHYSICIAN:  Dr. Hillery AldoSarah Patel and also she wanted a pulmonary rehab consult, so we have arranged for outpatient pulmonary rehab.   TIME SPENT ON DISCHARGE PREPARATION: More than 30 minutes.    ____________________________ Katha HammingSnehalatha Elijah Michaelis, MD sk:cg D: 07/09/2013 21:43:28 ET T: 07/10/2013 00:08:33 ET JOB#: [id number]  cc: Katha HammingSnehalatha Elie Leppo, MD, <Dictator> Katha HammingSNEHALATHA Brier Reid MD ELECTRONICALLY SIGNED 07/31/2013 23:20

## 2015-07-22 ENCOUNTER — Ambulatory Visit
Admission: RE | Admit: 2015-07-22 | Discharge: 2015-07-22 | Disposition: A | Discharge status: Home / Self Care | Payer: Medicare HMO | Source: Ambulatory Visit | Attending: Family Medicine | Admitting: Family Medicine

## 2015-07-22 ENCOUNTER — Other Ambulatory Visit: Payer: Self-pay | Admitting: Family Medicine

## 2015-07-22 DIAGNOSIS — J449 Chronic obstructive pulmonary disease, unspecified: Secondary | ICD-10-CM

## 2015-07-22 DIAGNOSIS — R1907 Generalized intra-abdominal and pelvic swelling, mass and lump: Secondary | ICD-10-CM

## 2015-09-05 ENCOUNTER — Emergency Department: Payer: Medicare HMO

## 2015-09-05 ENCOUNTER — Emergency Department
Admission: EM | Admit: 2015-09-05 | Discharge: 2015-09-05 | Disposition: A | Discharge status: Home / Self Care | Payer: Medicare HMO | Attending: Emergency Medicine | Admitting: Emergency Medicine

## 2015-09-05 DIAGNOSIS — Z79899 Other long term (current) drug therapy: Secondary | ICD-10-CM | POA: Diagnosis not present

## 2015-09-05 DIAGNOSIS — R1084 Generalized abdominal pain: Secondary | ICD-10-CM | POA: Insufficient documentation

## 2015-09-05 DIAGNOSIS — G8929 Other chronic pain: Secondary | ICD-10-CM | POA: Diagnosis not present

## 2015-09-05 DIAGNOSIS — R0602 Shortness of breath: Secondary | ICD-10-CM | POA: Diagnosis present

## 2015-09-05 DIAGNOSIS — J441 Chronic obstructive pulmonary disease with (acute) exacerbation: Secondary | ICD-10-CM | POA: Diagnosis not present

## 2015-09-05 DIAGNOSIS — N814 Uterovaginal prolapse, unspecified: Secondary | ICD-10-CM | POA: Diagnosis not present

## 2015-09-05 DIAGNOSIS — I10 Essential (primary) hypertension: Secondary | ICD-10-CM | POA: Diagnosis not present

## 2015-09-05 DIAGNOSIS — Z7951 Long term (current) use of inhaled steroids: Secondary | ICD-10-CM | POA: Insufficient documentation

## 2015-09-05 LAB — CBC WITH DIFFERENTIAL/PLATELET
BASOS ABS: 0.1 10*3/uL (ref 0–0.1)
Basophils Relative: 1 %
EOS PCT: 4 %
Eosinophils Absolute: 0.3 10*3/uL (ref 0–0.7)
HEMATOCRIT: 39 % (ref 35.0–47.0)
Hemoglobin: 12.9 g/dL (ref 12.0–16.0)
LYMPHS ABS: 2.1 10*3/uL (ref 1.0–3.6)
LYMPHS PCT: 22 %
MCH: 27.5 pg (ref 26.0–34.0)
MCHC: 33.1 g/dL (ref 32.0–36.0)
MCV: 83 fL (ref 80.0–100.0)
MONO ABS: 0.7 10*3/uL (ref 0.2–0.9)
MONOS PCT: 8 %
Neutro Abs: 6.3 10*3/uL (ref 1.4–6.5)
Neutrophils Relative %: 65 %
PLATELETS: 215 10*3/uL (ref 150–440)
RBC: 4.7 MIL/uL (ref 3.80–5.20)
RDW: 14.8 % — AB (ref 11.5–14.5)
WBC: 9.6 10*3/uL (ref 3.6–11.0)

## 2015-09-05 LAB — BASIC METABOLIC PANEL
Anion gap: 9 (ref 5–15)
BUN: 12 mg/dL (ref 6–20)
CALCIUM: 9.6 mg/dL (ref 8.9–10.3)
CO2: 28 mmol/L (ref 22–32)
CREATININE: 0.93 mg/dL (ref 0.44–1.00)
Chloride: 106 mmol/L (ref 101–111)
GFR calc Af Amer: >60 mL/min (ref >60)
GFR, EST NON AFRICAN AMERICAN: 57 mL/min — AB (ref >60)
GLUCOSE: 108 mg/dL — AB (ref 65–99)
Potassium: 4 mmol/L (ref 3.5–5.1)
Sodium: 143 mmol/L (ref 135–145)

## 2015-09-05 LAB — TROPONIN I: Troponin I: <0.03 ng/mL (ref <0.031)

## 2015-09-05 MED ORDER — ALBUTEROL SULFATE (2.5 MG/3ML) 0.083% IN NEBU
7.5000 mg | INHALATION_SOLUTION | Freq: Once | RESPIRATORY_TRACT | Status: AC
  Administered 2015-09-05: 7.5 mg via RESPIRATORY_TRACT
  Filled 2015-09-05: qty 9

## 2015-09-05 MED ORDER — AMPICILLIN 250 MG PO CAPS: 250.0000 mg | ORAL_CAPSULE | Freq: Four times a day (QID) | ORAL | Status: DC

## 2015-09-05 MED ORDER — AMPICILLIN 250 MG PO CAPS: 250.0000 mg | ORAL_CAPSULE | Freq: Three times a day (TID) | ORAL | Status: AC

## 2015-09-05 MED ORDER — METHYLPREDNISOLONE SODIUM SUCC 125 MG IJ SOLR
125.0000 mg | Freq: Once | INTRAMUSCULAR | Status: AC
  Administered 2015-09-05: 125 mg via INTRAVENOUS
  Filled 2015-09-05: qty 2

## 2015-09-05 NOTE — ED Notes (Signed)
Walked Pt down hallway, O2 sats stayed within normal limits, pt states she felt SOB. EDP made aware

## 2015-09-05 NOTE — ED Notes (Signed)
Dr quale at bedside. 

## 2015-09-05 NOTE — ED Notes (Signed)
According to EMS, pt c/o of SOB, dry non-productive cough that has worsened over 7 days. Pt was seen by [facility] earlier today and was referred to be seen here due to worsening symptoms and numerous allergies. Pt has hx of COPD and PE.  EMS Vitals  98% on 3L 189/109 BP  78 HR

## 2015-09-05 NOTE — Discharge Instructions (Signed)
We believe that your symptoms are caused today by an exacerbation of your COPD, and possibly bronchitis.  Please take the prescribed medications and any medications that you have at home for your COPD.  Follow up with your doctor as recommended.  If you develop any new or worsening symptoms, including but not limited to fever, persistent vomiting, worsening shortness of breath, or other symptoms that concern you, please return to the Emergency Department immediately. ° ° °Chronic Obstructive Pulmonary Disease Exacerbation °Chronic obstructive pulmonary disease (COPD) is a common lung condition in which airflow from the lungs is limited. COPD is a general term that can be used to describe many different lung problems that limit airflow, including chronic bronchitis and emphysema. COPD exacerbations are episodes when breathing symptoms become much worse and require extra treatment. Without treatment, COPD exacerbations can be life threatening, and frequent COPD exacerbations can cause further damage to your lungs. °CAUSES °· Respiratory infections. °· Exposure to smoke. °· Exposure to air pollution, chemical fumes, or dust. °Sometimes there is no apparent cause or trigger. °RISK FACTORS °· Smoking cigarettes. °· Older age. °· Frequent prior COPD exacerbations. °SIGNS AND SYMPTOMS °· Increased coughing. °· Increased thick spit (sputum) production. °· Increased wheezing. °· Increased shortness of breath. °· Rapid breathing. °· Chest tightness. °DIAGNOSIS °Your medical history, a physical exam, and tests will help your health care provider make a diagnosis. Tests may include: °· A chest X-ray. °· Basic lab tests. °· Sputum testing. °· An arterial blood gas test. °TREATMENT °Depending on the severity of your COPD exacerbation, you may need to be admitted to a hospital for treatment. Some of the treatments commonly used to treat COPD exacerbations are:  °· Antibiotic medicines. °· Bronchodilators. These are drugs that  expand the air passages. They may be given with an inhaler or nebulizer. Spacer devices may be needed to help improve drug delivery. °· Corticosteroid medicines. °· Supplemental oxygen therapy. °· Airway clearing techniques, such as noninvasive ventilation (NIV) and positive expiratory pressure (PEP). These provide respiratory support through a mask or other noninvasive device. °HOME CARE INSTRUCTIONS °· Do not smoke. Quitting smoking is very important to prevent COPD from getting worse and exacerbations from happening as often. °· Avoid exposure to all substances that irritate the airway, especially to tobacco smoke. °· If you were prescribed an antibiotic medicine, finish it all even if you start to feel better. °· Take all medicines as directed by your health care provider. It is important to use correct technique with inhaled medicines. °· Drink enough fluids to keep your urine clear or pale yellow (unless you have a medical condition that requires fluid restriction). °· Use a cool mist vaporizer. This makes it easier to clear your chest when you cough. °· If you have a home nebulizer and oxygen, continue to use them as directed. °· Maintain all necessary vaccinations to prevent infections. °· Exercise regularly. °· Eat a healthy diet. °· Keep all follow-up appointments as directed by your health care provider. °SEEK IMMEDIATE MEDICAL CARE IF: °· You have worsening shortness of breath. °· You have trouble talking. °· You have severe chest pain. °· You have blood in your sputum. °· You have a fever. °· You have weakness, vomit repeatedly, or faint. °· You feel confused. °· You continue to get worse. °MAKE SURE YOU: °· Understand these instructions. °· Will watch your condition. °· Will get help right away if you are not doing well or get worse. °  °This information is not   intended to replace advice given to you by your health care provider. Make sure you discuss any questions you have with your health care  provider. °  °Document Released: 07/18/2007 Document Revised: 10/11/2014 Document Reviewed: 05/25/2013 °Elsevier Interactive Patient Education ©2016 [employer] ° °

## 2015-09-05 NOTE — ED Provider Notes (Signed)
[facility] Emergency Department Provider Note  ____________________________________________  Time seen: Seen upon arrival to the emergency department  I have reviewed the triage vital signs and the nursing notes.   HISTORY  Chief Complaint Shortness of Breath    HPI Brooke ArchBarbara A Perez is a 81 y.o. female with a history of COPD who is presenting today with 1 week of worsening cough, fever night and throat pain. She denies any known sick contacts with that she had multiple exposures to people over Thanksgiving and is concerned she may have picked something up then. She has been using albuterol at home but without any relief. She was [facility] today and sent to the emergency department for further evaluation for severe dyspnea after walking only a few steps. The patient is on when necessary oxygen at home. Patient is a history of PE but says she is no longer on medications for this.She also says that she is multiple allergies including to by mouth steroids but has tolerated IV steroids in the past with close supervision. She also cannot tolerate Atrovent. She says that her allergies are her throat closing up.   Past Medical History  Diagnosis Date  . Allergy 2012    ? benzimidazole, IV dye  . Arthritis     since 1997  . Hernia 2011  . Hypertension 1997    since 1997  . Blood in stool   . Breast screening, unspecified   . Special screening for malignant neoplasms, colon   . History of cholecystitis 1986  . H/O blood clots 2004    Patient Active Problem List   Diagnosis Date Noted  . Dyspnea 10/29/2013  . H/O blood clots     Past Surgical History  Procedure Laterality Date  . Broken hand Right 1986  . Tonsillectomy  1954  . Goiter removal  1965  . Abdominal hysterectomy  1968  . Tubal ligation  1968  . Cholecystectomy  1999    h/o cholecystitis in 1986  . Colon surgery  2000  . Colonoscopy w/ polypectomy   12/25/2010    one 5mm semisessile polyp was found in the descending colon    Current Outpatient Rx  Name  Route  Sig  Dispense  Refill  . albuterol (PROVENTIL) (2.5 MG/3ML) 0.083% nebulizer solution   Nebulization   Take 2.5 mg by nebulization every 6 (six) hours as needed for wheezing or shortness of breath.         Marland Kitchen. albuterol (VENTOLIN HFA) 108 (90 BASE) MCG/ACT inhaler   Inhalation   Inhale 2 puffs into the lungs every 6 (six) hours as needed for wheezing or shortness of breath.         Marland Kitchen. amLODipine (NORVASC) 5 MG tablet   Oral   Take 5 mg by mouth daily.         . chlorpheniramine-HYDROcodone (TUSSIONEX PENNKINETIC ER) 10-8 MG/5ML LQCR   Oral   Take 5 mLs by mouth every 12 (twelve) hours as needed for cough.   120 mL   0   . Fluticasone Furoate-Vilanterol (BREO ELLIPTA) 100-25 MCG/INH AEPB   Inhalation   Inhale 1 puff into the lungs daily.   1 each   6   . levothyroxine (SYNTHROID, LEVOTHROID) 75 MCG tablet   Oral   Take 75 mcg by mouth daily before breakfast.         . Polyethylene Glycol 3350 (MIRALAX PO)   Oral  Take 1 Package by mouth daily.           Allergies Alprazolam; Aspirin; Atenolol; Atrovent; Ciprofloxacin; Clonidine derivatives; Codeine; Contrast media; Coumadin; Darvon; Demerol; Flovent; Guiatuss; Iohexol; Keflex; Morphine and related; Nitroglycerin; Parafon forte dsc; Plavix; Prednisone (pak); Pyrimethamine; Sulfa antibiotics; and Zithromax  Family History  Problem Relation Age of Onset  . Emphysema Sister     smoked  . Emphysema Sister     smoked  . Asthma Sister   . Asthma Brother   . Heart disease Mother   . Heart disease Maternal Uncle   . Heart disease Brother     Social History Social History  Substance Use Topics  . Smoking status: Never Smoker   . Smokeless tobacco: Never Used  . Alcohol Use: No    Review of Systems Constitutional: No fever/chills Eyes: No visual changes. ENT: No sore throat. Cardiovascular:  Denies chest pain. Respiratory: As above Gastrointestinal: Chronic diffuse abdominal pain secondary to uterine prolapse. She says the pain is unchanged at this time. No nausea, no vomiting.  No diarrhea.  No constipation. Genitourinary: Negative for dysuria. Musculoskeletal: Negative for back pain. Skin: Negative for rash. Neurological: Negative for headaches, focal weakness or numbness.  10-point ROS otherwise negative.  ____________________________________________   PHYSICAL EXAM:  VITAL SIGNS: ED Triage Vitals  Enc Vitals Group     BP 09/05/15 1254 167/95 mmHg     Pulse Rate 09/05/15 1254 74     Resp --      Temp 09/05/15 1254 97.7 F (36.5 C)     Temp Source 09/05/15 1254 Oral     SpO2 09/05/15 1254 97 %     Weight --      Height --      Head Cir --      Peak Flow --      Pain Score 09/05/15 1305 5     Pain Loc --      Pain Edu? --      Excl. in GC? --     Constitutional: Alert and oriented. Well appearing and in no acute distress. Eyes: Conjunctivae are normal. PERRL. EOMI. Head: Atraumatic. Nose: No congestion/rhinnorhea. Mouth/Throat: Mucous membranes are moist.  Oropharynx non-erythematous. Neck: No stridor.   Cardiovascular: Normal rate, regular rhythm. Grossly normal heart sounds.  Good peripheral circulation. Respiratory: Tachypneic without retractions. Speaks in full sentences. For a poor air movement on exam with prolonged expiratory phase and mild wheezing throughout which I suspect is only diminished because of the poor air movement. Gastrointestinal: Soft and nontender. No distention. No abdominal bruits. No CVA tenderness. Musculoskeletal: No lower extremity tenderness nor edema.  No joint effusions. Neurologic:  Normal speech and language. No gross focal neurologic deficits are appreciated. No gait instability. Skin:  Skin is warm, dry and intact. No rash noted. Psychiatric: Mood and affect are normal. Speech and behavior are  normal.  ____________________________________________   LABS (all labs ordered are listed, but only abnormal results are displayed)  Labs Reviewed  CBC WITH DIFFERENTIAL/PLATELET - Abnormal; Notable for the following:    RDW 14.8 (*)    All other components within normal limits  BASIC METABOLIC PANEL - Abnormal; Notable for the following:    Glucose, Bld 108 (*)    GFR calc non Af Amer 57 (*)    All other components within normal limits  TROPONIN I   ____________________________________________  EKG  ED ECG REPORT I, Arelia Longest, the attending physician, personally viewed and interpreted this  ECG.   Date: 09/05/2015  EKG Time: 1306  Rate: 78  Rhythm: normal sinus rhythm  Axis: Borderline left axis  Intervals: Prolonged QT interval at 585.  ST&T Change: No ST segment elevation or depression. No abnormal T-wave inversion with very poor baseline.  ____________________________________________  RADIOLOGY  Subsegmental atelectasis at the left base. I personally reviewed these images.  ____________________________________________   PROCEDURES    ____________________________________________   INITIAL IMPRESSION / ASSESSMENT AND PLAN / ED COURSE  Pertinent labs & imaging results that were available during my care of the patient were reviewed by me and considered in my medical decision making (see chart for details). ----------------------------------------- 4:17 PM on 09/05/2015 -----------------------------------------  Patient reevaluated and lungs are clear but the patient still says she is short of breath and has a "chest tightness" that she usually experiences with COPD. I did discuss with her when she had her pulmonary was in she said she had overt chest pain at that time and passed out. She says that this does not feel the same as when she had her pulmonary embolus. She says this is a she typically feels with her COPD. He was walked around the emergency  department by nursing and although she did not desaturate she says that she felt very short of breath and dizzy during this time. She received her IV steroids just before the walk around the emergency department. I will give her dose of antibiotics as well as an additional albuterol treatment. The patient was already prescribed ampicillin for home use her primary care doctor this morning. Signed out to Dr. Fanny Bien who will reassess the patient after the steroids have had time to take effect. The patient's old records were reviewed and she has tolerated IV Solu-Medrol the past. She was not sent home on by mouth steroids after her last admission due to her multiple allergies.   ____________________________________________   FINAL CLINICAL IMPRESSION(S) / ED DIAGNOSES  COPD    Myrna Blazer, MD 09/05/15 [phone]

## 2015-10-09 ENCOUNTER — Emergency Department
Admission: EM | Admit: 2015-10-09 | Discharge: 2015-10-09 | Disposition: A | Discharge status: Home / Self Care | Payer: Medicare HMO | Attending: Emergency Medicine | Admitting: Emergency Medicine

## 2015-10-09 ENCOUNTER — Emergency Department: Payer: Medicare HMO

## 2015-10-09 ENCOUNTER — Encounter: Payer: Self-pay | Admitting: Emergency Medicine

## 2015-10-09 DIAGNOSIS — S8001XA Contusion of right knee, initial encounter: Secondary | ICD-10-CM | POA: Diagnosis not present

## 2015-10-09 DIAGNOSIS — W06XXXA Fall from bed, initial encounter: Secondary | ICD-10-CM | POA: Insufficient documentation

## 2015-10-09 DIAGNOSIS — M1711 Unilateral primary osteoarthritis, right knee: Secondary | ICD-10-CM | POA: Insufficient documentation

## 2015-10-09 DIAGNOSIS — Y92009 Unspecified place in unspecified non-institutional (private) residence as the place of occurrence of the external cause: Secondary | ICD-10-CM | POA: Insufficient documentation

## 2015-10-09 DIAGNOSIS — Y998 Other external cause status: Secondary | ICD-10-CM | POA: Diagnosis not present

## 2015-10-09 DIAGNOSIS — Y9384 Activity, sleeping: Secondary | ICD-10-CM | POA: Diagnosis not present

## 2015-10-09 DIAGNOSIS — Z79899 Other long term (current) drug therapy: Secondary | ICD-10-CM | POA: Insufficient documentation

## 2015-10-09 DIAGNOSIS — I10 Essential (primary) hypertension: Secondary | ICD-10-CM | POA: Diagnosis not present

## 2015-10-09 DIAGNOSIS — S8991XA Unspecified injury of right lower leg, initial encounter: Secondary | ICD-10-CM | POA: Diagnosis present

## 2015-10-09 DIAGNOSIS — Z7951 Long term (current) use of inhaled steroids: Secondary | ICD-10-CM | POA: Insufficient documentation

## 2015-10-09 NOTE — ED Provider Notes (Signed)
[facility] Emergency Department Provider Note ____________________________________________  Time seen: 1335  I have reviewed the triage vital signs and the nursing notes.  HISTORY  Chief Complaint  Fall  HPI DENESE MENTINK is a 82 y.o. female presents to the ED for evaluation of continued right knee pain after fall at home. She describes the fall occurred overnight where she was sleeping, and had a nightmare. She apparently dove out of the bed, hitting the right knee on the floor. She presents today with worsening knee pain that is aggravated by walking. She denies taking any interim medication other than Tylenol.She rates the pain to her knee at a 3/10 in triage.  Past Medical History  Diagnosis Date  . Allergy 2012    ? benzimidazole, IV dye  . Arthritis     since 1997  . Hypertension 1997    since 1997  . Blood in stool   . Breast screening, unspecified   . Special screening for malignant neoplasms, colon   . History of cholecystitis 1986  . H/O blood clots 2004    Patient Active Problem List   Diagnosis Date Noted  . Dyspnea 10/29/2013  . H/O blood clots     Past Surgical History  Procedure Laterality Date  . Broken hand Right 1986  . Tonsillectomy  1954  . Goiter removal  1965  . Abdominal hysterectomy  1968  . Tubal ligation  1968  . Cholecystectomy  1999    h/o cholecystitis in 1986  . Colon surgery  2000  . Colonoscopy w/ polypectomy  12/25/2010    one 5mm semisessile polyp was found in the descending colon    Current Outpatient Rx  Name  Route  Sig  Dispense  Refill  . albuterol (PROVENTIL) (2.5 MG/3ML) 0.083% nebulizer solution   Nebulization   Take 2.5 mg by nebulization every 6 (six) hours as needed for wheezing or shortness of breath.         Marland Kitchen albuterol (VENTOLIN HFA) 108 (90 BASE) MCG/ACT inhaler   Inhalation   Inhale 2 puffs into the lungs every 6 (six) hours as needed for wheezing or shortness of breath.          Marland Kitchen amLODipine (NORVASC) 5 MG tablet   Oral   Take 5 mg by mouth daily.         . chlorpheniramine-HYDROcodone (TUSSIONEX PENNKINETIC ER) 10-8 MG/5ML LQCR   Oral   Take 5 mLs by mouth every 12 (twelve) hours as needed for cough.   120 mL   0   . Fluticasone Furoate-Vilanterol (BREO ELLIPTA) 100-25 MCG/INH AEPB   Inhalation   Inhale 1 puff into the lungs daily.   1 each   6   . levothyroxine (SYNTHROID, LEVOTHROID) 75 MCG tablet   Oral   Take 75 mcg by mouth daily before breakfast.         . Polyethylene Glycol 3350 (MIRALAX PO)   Oral   Take 1 Package by mouth daily.           Allergies Alprazolam; Aspirin; Atenolol; Atrovent; Ciprofloxacin; Clonidine derivatives; Codeine; Contrast media; Coumadin; Darvon; Demerol; Flovent; Guiatuss; Iohexol; Keflex; Morphine and related; Nitroglycerin; Parafon forte dsc; Plavix; Prednisone (pak); Pyrimethamine; Sulfa antibiotics; and Zithromax  Family History  Problem Relation Age of Onset  . Emphysema Sister     smoked  . Emphysema Sister     smoked  . Asthma Sister   . Asthma Brother   . Heart disease Mother   .  Heart disease Maternal Uncle   . Heart disease Brother     Social History Social History  Substance Use Topics  . Smoking status: Never Smoker   . Smokeless tobacco: Never Used  . Alcohol Use: No    Review of Systems  Constitutional: Negative for fever. Eyes: Negative for visual changes. ENT: Negative for sore throat. Cardiovascular: Negative for chest pain. Respiratory: Negative for shortness of breath. Gastrointestinal: Negative for abdominal pain, vomiting and diarrhea. Genitourinary: Negative for dysuria. Musculoskeletal: Negative for back pain. Right knee pain as above. Skin: Negative for rash. Neurological: Negative for headaches, focal weakness or numbness. ____________________________________________  PHYSICAL EXAM:  VITAL SIGNS: ED Triage Vitals  Enc Vitals Group     BP 10/09/15 1221  150/90 mmHg     Pulse Rate 10/09/15 1221 72     Resp 10/09/15 1221 18     Temp 10/09/15 1221 98.2 F (36.8 C)     Temp Source 10/09/15 1221 Oral     SpO2 10/09/15 1221 96 %     Weight 10/09/15 1221 140 lb (63.504 kg)     Height 10/09/15 1221 5\' 6"  (1.676 m)     Head Cir --      Peak Flow --      Pain Score 10/09/15 1222 3     Pain Loc --      Pain Edu? --      Excl. in GC? --    Constitutional: Alert and oriented. Well appearing and in no distress. Head: Normocephalic and atraumatic.      Eyes: Conjunctivae are normal. PERRL. Normal extraocular movements      Ears: Canals clear. TMs intact bilaterally.   Nose: No congestion/rhinorrhea.   Mouth/Throat: Mucous membranes are moist.   Neck: Supple. No thyromegaly. Hematological/Lymphatic/Immunological: No cervical lymphadenopathy. Cardiovascular: Normal rate, regular rhythm.  Respiratory: Normal respiratory effort. No wheezes/rales/rhonchi. Gastrointestinal: Soft and nontender. No distention. Musculoskeletal: Right knee with degenerative changes consistent with arthritis. No obvious abrasion, erythema or laceration noted. Mild joint effusion is appreciated. Tender to palpation medially, but no valgus or varus joint stress as noted. Patient with flexion and extension range the knee without deficit. Nontender with normal range of motion in all extremities.  Neurologic:  Normal gait without ataxia. Normal speech and language. No gross focal neurologic deficits are appreciated. Skin:  Skin is warm, dry and intact. No rash noted. Psychiatric: Mood and affect are normal. Patient exhibits appropriate insight and judgment. ___________________________________________   RADIOLOGY Right Knee IMPRESSION: No fracture or dislocation. Small joint effusion. Areas of osteoarthritic change. Chondrocalcinosis is noted medially. This finding may be due to osteoarthritis; however, this finding is also associated with calcium pyrophosphate  deposition disease. ____________________________________________  INITIAL IMPRESSION / ASSESSMENT AND PLAN / ED COURSE  Patient with right knee contusion and mild effusion with underlying osteoarthritis. No indication of internal derangement or acute fracture dislocation. She is discharged with instruction to continue dosing Tylenol for pain. She will continue to apply ice for comfort. She'll follow with primary care provider for ongoing symptoms. Patient declined use of an Ace bandage in the ED. ____________________________________________  FINAL CLINICAL IMPRESSION(S) / ED DIAGNOSES  Final diagnoses:  Knee contusion, right, initial encounter  Primary osteoarthritis of right knee      Lissa HoardJenise V Bacon Abid Bolla, PA-C 10/09/15 1447  Emily FilbertJonathan E Williams, MD 10/09/15 1536

## 2015-10-09 NOTE — ED Notes (Signed)
NAD noted at time of D/C. Pt denies questions or concerns. Pt taken to the lobby via wheelchair at this time.  

## 2015-10-09 NOTE — Discharge Instructions (Signed)
Cryotherapy Cryotherapy is when you put ice on your injury. Ice helps lessen pain and puffiness (swelling) after an injury. Ice works the best when you start using it in the first 24 to 48 hours after an injury. HOME CARE  Put a dry or damp towel between the ice pack and your skin.  You may press gently on the ice pack.  Leave the ice on for no more than 10 to 20 minutes at a time.  Check your skin after 5 minutes to make sure your skin is okay.  Rest at least 20 minutes between ice pack uses.  Stop using ice when your skin loses feeling (numbness).  Do not use ice on someone who cannot tell you when it hurts. This includes small children and people with memory problems (dementia). GET HELP RIGHT AWAY IF:  You have white spots on your skin.  Your skin turns blue or pale.  Your skin feels waxy or hard.  Your puffiness gets worse. MAKE SURE YOU:   Understand these instructions.  Will watch your condition.  Will get help right away if you are not doing well or get worse.   This information is not intended to replace advice given to you by your health care provider. Make sure you discuss any questions you have with your health care provider.   Document Released: 03/08/2008 Document Revised: 12/13/2011 Document Reviewed: 05/13/2011 Elsevier Interactive Patient Education 2016 Elsevier Inc.  Osteoarthritis Osteoarthritis is a disease that causes soreness and inflammation of a joint. It occurs when the cartilage at the affected joint wears down. Cartilage acts as a cushion, covering the ends of bones where they meet to form a joint. Osteoarthritis is the most common form of arthritis. It often occurs in older people. The joints affected most often by this condition include those in the:  Ends of the fingers.  Thumbs.  Neck.  Lower back.  Knees.  Hips. CAUSES  Over time, the cartilage that covers the ends of bones begins to wear away. This causes bone to rub on bone,  producing pain and stiffness in the affected joints.  RISK FACTORS Certain factors can increase your chances of having osteoarthritis, including:  Older age.  Excessive body weight.  Overuse of joints.  Previous joint injury. SIGNS AND SYMPTOMS   Pain, swelling, and stiffness in the joint.  Over time, the joint may lose its normal shape.  Small deposits of bone (osteophytes) may grow on the edges of the joint.  Bits of bone or cartilage can break off and float inside the joint space. This may cause more pain and damage. DIAGNOSIS  Your health care provider will do a physical exam and ask about your symptoms. Various tests may be ordered, such as:  X-rays of the affected joint.  Blood tests to rule out other types of arthritis. Additional tests may be used to diagnose your condition. TREATMENT  Goals of treatment are to control pain and improve joint function. Treatment plans may include:  A prescribed exercise program that allows for rest and joint relief.  A weight control plan.  Pain relief techniques, such as:  Properly applied heat and cold.  Electric pulses delivered to nerve endings under the skin (transcutaneous electrical nerve stimulation [TENS]).  Massage.  Certain nutritional supplements.  Medicines to control pain, such as:  Acetaminophen.  Nonsteroidal anti-inflammatory drugs (NSAIDs), such as naproxen.  Narcotic or central-acting agents, such as tramadol.  Corticosteroids. These can be given orally or as an injection.  Surgery to reposition the bones and relieve pain (osteotomy) or to remove loose pieces of bone and cartilage. Joint replacement may be needed in advanced states of osteoarthritis. HOME CARE INSTRUCTIONS   Take medicines only as directed by your health care provider.  Maintain a healthy weight. Follow your health care provider's instructions for weight control. This may include dietary instructions.  Exercise as directed. Your  health care provider can recommend specific types of exercise. These may include:  Strengthening exercises. These are done to strengthen the muscles that support joints affected by arthritis. They can be performed with weights or with exercise bands to add resistance.  Aerobic activities. These are exercises, such as brisk walking or low-impact aerobics, that get your heart pumping.  Range-of-motion activities. These keep your joints limber.  Balance and agility exercises. These help you maintain daily living skills.  Rest your affected joints as directed by your health care provider.  Keep all follow-up visits as directed by your health care provider. SEEK MEDICAL CARE IF:   Your skin turns red.  You develop a rash in addition to your joint pain.  You have worsening joint pain.  You have a fever along with joint or muscle aches. SEEK IMMEDIATE MEDICAL CARE IF:  You have a significant loss of weight or appetite.  You have night sweats. FOR MORE INFORMATION   National Institute of Arthritis and Musculoskeletal and Skin Diseases: www.niams.http://www.myers.net/nih.gov  [employer]: https://walker.com/www.nia.nih.gov  American College of Rheumatology: www.rheumatology.org   This information is not intended to replace advice given to you by your health care provider. Make sure you discuss any questions you have with your health care provider.   Document Released: 09/20/2005 Document Revised: 10/11/2014 Document Reviewed: 05/28/2013 Elsevier Interactive Patient Education [employer] Elsevier Inc.   Your exam and x-ray show injury by strain to the knee. There is arthritis in the knee joint, confirmed by x-ray. You should take OTC Tylenol as needed for pain relief. Apply ice and rest with the leg elevated as needed. Follow-up with Dr. Allena KatzPatel for ongoing symptoms.
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
## 2015-10-20 DIAGNOSIS — I10 Essential (primary) hypertension: Secondary | ICD-10-CM | POA: Insufficient documentation

## 2015-10-20 DIAGNOSIS — R0602 Shortness of breath: Secondary | ICD-10-CM | POA: Insufficient documentation

## 2015-10-20 DIAGNOSIS — R600 Localized edema: Secondary | ICD-10-CM | POA: Insufficient documentation

## 2015-10-27 DIAGNOSIS — I340 Nonrheumatic mitral (valve) insufficiency: Secondary | ICD-10-CM | POA: Insufficient documentation

## 2015-12-08 ENCOUNTER — Emergency Department
Admission: EM | Admit: 2015-12-08 | Discharge: 2015-12-08 | Disposition: A | Discharge status: Home / Self Care | Payer: Medicare HMO | Attending: Emergency Medicine | Admitting: Emergency Medicine

## 2015-12-08 ENCOUNTER — Encounter: Payer: Self-pay | Admitting: Emergency Medicine

## 2015-12-08 ENCOUNTER — Emergency Department: Payer: Medicare HMO

## 2015-12-08 DIAGNOSIS — R519 Headache, unspecified: Secondary | ICD-10-CM

## 2015-12-08 DIAGNOSIS — R51 Headache: Secondary | ICD-10-CM | POA: Insufficient documentation

## 2015-12-08 DIAGNOSIS — Z79899 Other long term (current) drug therapy: Secondary | ICD-10-CM | POA: Insufficient documentation

## 2015-12-08 DIAGNOSIS — I10 Essential (primary) hypertension: Secondary | ICD-10-CM | POA: Diagnosis not present

## 2015-12-08 DIAGNOSIS — Z7951 Long term (current) use of inhaled steroids: Secondary | ICD-10-CM | POA: Diagnosis not present

## 2015-12-08 DIAGNOSIS — R4182 Altered mental status, unspecified: Secondary | ICD-10-CM | POA: Insufficient documentation

## 2015-12-08 DIAGNOSIS — M542 Cervicalgia: Secondary | ICD-10-CM | POA: Insufficient documentation

## 2015-12-08 LAB — COMPREHENSIVE METABOLIC PANEL
ALT: 12 U/L — ABNORMAL LOW (ref 14–54)
ANION GAP: 8 (ref 5–15)
AST: 20 U/L (ref 15–41)
Albumin: 4.4 g/dL (ref 3.5–5.0)
Alkaline Phosphatase: 73 U/L (ref 38–126)
BILIRUBIN TOTAL: 0.9 mg/dL (ref 0.3–1.2)
BUN: 10 mg/dL (ref 6–20)
CHLORIDE: 108 mmol/L (ref 101–111)
CO2: 25 mmol/L (ref 22–32)
Calcium: 9.5 mg/dL (ref 8.9–10.3)
Creatinine, Ser: 0.78 mg/dL (ref 0.44–1.00)
Glucose, Bld: 103 mg/dL — ABNORMAL HIGH (ref 65–99)
POTASSIUM: 4.1 mmol/L (ref 3.5–5.1)
Sodium: 141 mmol/L (ref 135–145)
TOTAL PROTEIN: 7.6 g/dL (ref 6.5–8.1)

## 2015-12-08 LAB — DIFFERENTIAL
BASOS ABS: 0.1 10*3/uL (ref 0–0.1)
BASOS PCT: 1 %
EOS ABS: 0.3 10*3/uL (ref 0–0.7)
EOS PCT: 3 %
LYMPHS ABS: 1.7 10*3/uL (ref 1.0–3.6)
Lymphocytes Relative: 17 %
MONOS PCT: 7 %
Monocytes Absolute: 0.7 10*3/uL (ref 0.2–0.9)
Neutro Abs: 7.3 10*3/uL — ABNORMAL HIGH (ref 1.4–6.5)
Neutrophils Relative %: 72 %

## 2015-12-08 LAB — CBC
HEMATOCRIT: 37.1 % (ref 35.0–47.0)
HEMOGLOBIN: 12.8 g/dL (ref 12.0–16.0)
MCH: 27.7 pg (ref 26.0–34.0)
MCHC: 34.3 g/dL (ref 32.0–36.0)
MCV: 80.7 fL (ref 80.0–100.0)
Platelets: 212 10*3/uL (ref 150–440)
RBC: 4.6 MIL/uL (ref 3.80–5.20)
RDW: 15 % — AB (ref 11.5–14.5)
WBC: 10 10*3/uL (ref 3.6–11.0)

## 2015-12-08 LAB — PROTIME-INR
INR: 1.03
Prothrombin Time: 13.7 seconds (ref 11.4–15.0)

## 2015-12-08 LAB — APTT: APTT: 29 s (ref 24–36)

## 2015-12-08 MED ORDER — KETOROLAC TROMETHAMINE 30 MG/ML IJ SOLN
60.0000 mg | Freq: Once | INTRAMUSCULAR | Status: AC
  Administered 2015-12-08: 60 mg via INTRAMUSCULAR
  Filled 2015-12-08: qty 2

## 2015-12-08 NOTE — ED Notes (Addendum)
Pt reports left sided headache "the worse pain" for the past 2 days; reports pain radiates down left side of neck and some dizziness.

## 2015-12-08 NOTE — Discharge Instructions (Signed)
Today, there is no evidence that you're having a stroke or other acute emergency. However, please return immediately if you develop numbness tingling or weakness, worsening pain, visual or speech changes, changes in mental status, fever, or any other symptoms concerning to you.  Sometimes, pain on the left side of the scalp like you have, can be the first symptom of shingles. Please continue to monitor your skin for any rash or changes.  Please make a follow-up appointment with your primary care physician. You may take Tylenol for pain.

## 2015-12-08 NOTE — ED Provider Notes (Addendum)
[facility] Emergency Department Provider Note  ____________________________________________  Time seen: Approximately 1:52 PM  I have reviewed the triage vital signs and the nursing notes.   HISTORY  Chief Complaint Headache    HPI Brooke Perez is a 82 y.o. female with a history of HTN presenting with left neck and left scalp pain. The patient [place] 3 [place] when she had the acute onset of a left scalp and left lateral neck pain that was severe. Patient denies any trauma or exertion at the time. She does not have any associated visual changes, speech changes, lightheadedness or dizziness. No numbness tickling or weakness. Over the next 3 days, her symptoms have improved but when she went to her primary care physician, she was encouraged to be evaluated in the emergency department. The patient did not try any medications. Positional changes or walking makes it worse. Resting on a pillow makes it better.   Past Medical History  Diagnosis Date  . Allergy 2012    ? benzimidazole, IV dye  . Arthritis     since 1997  . Hypertension 1997    since 1997  . Blood in stool   . Breast screening, unspecified   . Special screening for malignant neoplasms, colon   . History of cholecystitis 1986  . H/O blood clots 2004    Patient Active Problem List   Diagnosis Date Noted  . Dyspnea 10/29/2013  . H/O blood clots     Past Surgical History  Procedure Laterality Date  . Broken hand Right 1986  . Tonsillectomy  1954  . Goiter removal  1965  . Abdominal hysterectomy  1968  . Tubal ligation  1968  . Cholecystectomy  1999    h/o cholecystitis in 1986  . Colon surgery  2000  . Colonoscopy w/ polypectomy  12/25/2010    one 5mm semisessile polyp was found in the descending colon    Current Outpatient Rx  Name  Route  Sig  Dispense  Refill  . albuterol (PROVENTIL) (2.5 MG/3ML) 0.083% nebulizer solution   Nebulization   Take 2.5 mg by  nebulization every 6 (six) hours as needed for wheezing or shortness of breath.         Marland Kitchen. albuterol (VENTOLIN HFA) 108 (90 BASE) MCG/ACT inhaler   Inhalation   Inhale 2 puffs into the lungs every 6 (six) hours as needed for wheezing or shortness of breath.         Marland Kitchen. amLODipine (NORVASC) 5 MG tablet   Oral   Take 5 mg by mouth daily.         . chlorpheniramine-HYDROcodone (TUSSIONEX PENNKINETIC ER) 10-8 MG/5ML LQCR   Oral   Take 5 mLs by mouth every 12 (twelve) hours as needed for cough.   120 mL   0   . Fluticasone Furoate-Vilanterol (BREO ELLIPTA) 100-25 MCG/INH AEPB   Inhalation   Inhale 1 puff into the lungs daily.   1 each   6   . levothyroxine (SYNTHROID, LEVOTHROID) 75 MCG tablet   Oral   Take 75 mcg by mouth daily before breakfast.         . Polyethylene Glycol 3350 (MIRALAX PO)   Oral   Take 1 Package by mouth daily.           Allergies Alprazolam; Aspirin; Atenolol; Atrovent; Ciprofloxacin; Clonidine derivatives; Codeine; Contrast media; Coumadin; Darvon; Demerol; Flovent; Guiatuss; Iohexol; Keflex; Morphine and related; Nitroglycerin; Parafon forte dsc; Plavix; Prednisone (  pak); Pyrimethamine; Sulfa antibiotics; and Zithromax  Family History  Problem Relation Age of Onset  . Emphysema Sister     smoked  . Emphysema Sister     smoked  . Asthma Sister   . Asthma Brother   . Heart disease Mother   . Heart disease Maternal Uncle   . Heart disease Brother     Social History Social History  Substance Use Topics  . Smoking status: Never Smoker   . Smokeless tobacco: Never Used  . Alcohol Use: No    Review of Systems Constitutional: No fever/chills. No trauma. No lightheadedness or syncope. Eyes: No visual changes. No blurred or double vision. NECK: Left lateral neck pain. ENT: No sore throat. Cardiovascular: Denies chest pain, palpitations. Respiratory: Denies shortness of breath.  No cough. Gastrointestinal: No abdominal pain.  No nausea,  no vomiting.  No diarrhea.  No constipation. Genitourinary: Negative for dysuria. Musculoskeletal: Negative for back pain. Skin: Negative for rash. Neurological: Positive for left scalp and left lateral neck pain without true headache. No numbness tickling or weakness. No visual or speech changes. Changes in mental status. No difficulty walking.  10-point ROS otherwise negative.  ____________________________________________   PHYSICAL EXAM:  VITAL SIGNS: ED Triage Vitals  Enc Vitals Group     BP 12/08/15 1218 176/112 mmHg     Pulse Rate 12/08/15 1218 75     Resp 12/08/15 1218 16     Temp 12/08/15 1218 97.7 F (36.5 C)     Temp Source 12/08/15 1218 Oral     SpO2 12/08/15 1218 97 %     Weight 12/08/15 1218 146 lb (66.225 kg)     Height 12/08/15 1218  (1.676 m)     Head Cir --      Peak Flow --      Pain Score 12/08/15 1218 8     Pain Loc --      Pain Edu? --      Excl. in GC? --     Constitutional: Alert and oriented. Well appearing and in no acute distress. Answer question appropriately. Eyes: Conjunctivae are normal.  EOMI. PERRLA. Head: Atraumatic. Tenderness to palpation behind the left ear and on the left lateral neck; symptoms are also reproduced with walking or movement of the neck. No midline C-spine tenderness. No step-offs or deformities. No skin changes that would be consistent with shingles. Nose: No congestion/rhinnorhea. Mouth/Throat: Mucous membranes are moist.  Neck: No stridor.  Supple.  See above. No meningismus. Cardiovascular: Normal rate, regular rhythm. No murmurs, rubs or gallops.  Respiratory: Normal respiratory effort.  No retractions. Lungs CTAB.  No wheezes, rales or ronchi. Gastrointestinal: Soft and nontender. No distention. No peritoneal signs. Musculoskeletal: No LE edema.  Neurologic: Alert and oriented 3. Speech is clear. Face and smile symmetric. Tongue is midline. Normal cheek puff. 5 out of 5 grip, biceps, triceps, hip flexors,  plantar flexion and dorsiflexion. Normal sensation to light touch in the bilateral upper and lower extremities, and face. Normal gait without ataxia. .Skin:  Skin is warm, dry and intact. No rash noted. Psychiatric: Mood and affect are normal. Speech and behavior are normal.  Normal judgement.  ____________________________________________   LABS (all labs ordered are listed, but only abnormal results are displayed)  Labs Reviewed  CBC - Abnormal; Notable for the following:    RDW 15.0 (*)    All other components within normal limits  DIFFERENTIAL - Abnormal; Notable for the following:    Neutro Abs  7.3 (*)    All other components within normal limits  COMPREHENSIVE METABOLIC PANEL - Abnormal; Notable for the following:    Glucose, Bld 103 (*)    ALT 12 (*)    All other components within normal limits  PROTIME-INR  APTT   ____________________________________________  EKG  ED ECG REPORT I, Rockne Menghini, the attending physician, personally viewed and interpreted this ECG.   Date: 12/08/2015  EKG Time: 1225  Rate: 64  Rhythm: normal sinus rhythm  Axis: Leftward  Intervals:none  ST&T Change: No ST elevation or depression. No ischemic changes.  ____________________________________________  RADIOLOGY  Ct Head Wo Contrast  12/08/2015  CLINICAL DATA:  Left-sided headache for 2 days, initial encounter EXAM: CT HEAD WITHOUT CONTRAST TECHNIQUE: Contiguous axial images were obtained from the base of the skull through the vertex without intravenous contrast. COMPARISON:  01/11/2014 FINDINGS: Bony calvarium is intact. Mild atrophic changes are noted. Some areas of prior lacunar infarct are noted on the left in the subinsular region. No findings to suggest acute hemorrhage, acute infarction or space-occupying mass lesion are seen. IMPRESSION: Chronic atrophic and ischemic changes without acute abnormality. Electronically Signed   By: Alcide Clever M.D.   On: 12/08/2015 12:45     ____________________________________________   PROCEDURES  Procedure(s) performed: None  Critical Care performed: No ____________________________________________   INITIAL IMPRESSION / ASSESSMENT AND PLAN / ED COURSE  Pertinent labs & imaging results that were available during my care of the patient were reviewed by me and considered in my medical decision making (see chart for details).  82 y.o. female with hypertension presenting with left scalp and left lateral neck pain that started suddenly but has progressively been getting better over the past 3 days. No other associated neurologic symptoms nor any acute deficits on my exam. The patient is hypertensive on arrival to the emergency department and has advanced age. From triage, the patient had a CT scan which does not show any acute stroke, and without any acute neurologic deficits, CVA is less likely. I am unable to assess her posterior circulation given her IV contrast dye allergy, but she also does not have any ataxia or signs or symptoms of a posterior stroke. Given that her pain is positional and I'm able to reproduce it, and acute carotid dissection is also less likely. We will plan to treat her symptomatically and then have her follow-up with her primary care physician. Anticipate discharge.  ----------------------------------------- 2:09 PM on 12/08/2015 -----------------------------------------  The patient understands that her blood pressure here is high. She states that she has had hypertension in the past but was taken off of all of her antihypertensive because her blood pressure normalized. His possible that her blood pressure is high today due to pain are being sent here and the anxiety associated with that. I have encouraged her to recheck her blood pressure at home and bring her record with her to her primary care physician.  ____________________________________________  FINAL CLINICAL IMPRESSION(S) / ED  DIAGNOSES  Final diagnoses:  Scalp pain  Neck pain on left side  Essential hypertension      NEW MEDICATIONS STARTED DURING THIS VISIT:  New Prescriptions   No medications on file     Rockne Menghini, MD 12/08/15 1403  Rockne Menghini, MD 12/08/15 1411

## 2015-12-09 ENCOUNTER — Ambulatory Visit: Payer: Medicare HMO | Admitting: General Surgery

## 2016-04-09 ENCOUNTER — Emergency Department
Admission: EM | Admit: 2016-04-09 | Discharge: 2016-04-09 | Disposition: A | Discharge status: Home / Self Care | Payer: Medicare HMO | Attending: Emergency Medicine | Admitting: Emergency Medicine

## 2016-04-09 ENCOUNTER — Encounter: Payer: Self-pay | Admitting: Emergency Medicine

## 2016-04-09 ENCOUNTER — Emergency Department: Payer: Medicare HMO

## 2016-04-09 DIAGNOSIS — M1990 Unspecified osteoarthritis, unspecified site: Secondary | ICD-10-CM | POA: Diagnosis not present

## 2016-04-09 DIAGNOSIS — Z79899 Other long term (current) drug therapy: Secondary | ICD-10-CM | POA: Insufficient documentation

## 2016-04-09 DIAGNOSIS — R1030 Lower abdominal pain, unspecified: Secondary | ICD-10-CM | POA: Diagnosis present

## 2016-04-09 DIAGNOSIS — S299XXA Unspecified injury of thorax, initial encounter: Secondary | ICD-10-CM | POA: Insufficient documentation

## 2016-04-09 DIAGNOSIS — Z8719 Personal history of other diseases of the digestive system: Secondary | ICD-10-CM | POA: Diagnosis not present

## 2016-04-09 DIAGNOSIS — S0990XA Unspecified injury of head, initial encounter: Secondary | ICD-10-CM | POA: Insufficient documentation

## 2016-04-09 DIAGNOSIS — Y999 Unspecified external cause status: Secondary | ICD-10-CM | POA: Insufficient documentation

## 2016-04-09 DIAGNOSIS — Y92410 Unspecified street and highway as the place of occurrence of the external cause: Secondary | ICD-10-CM | POA: Diagnosis not present

## 2016-04-09 DIAGNOSIS — I10 Essential (primary) hypertension: Secondary | ICD-10-CM | POA: Diagnosis not present

## 2016-04-09 DIAGNOSIS — Y939 Activity, unspecified: Secondary | ICD-10-CM | POA: Insufficient documentation

## 2016-04-09 DIAGNOSIS — Z7951 Long term (current) use of inhaled steroids: Secondary | ICD-10-CM | POA: Diagnosis not present

## 2016-04-09 HISTORY — DX: Enteroptosis: K63.4

## 2016-04-09 LAB — BASIC METABOLIC PANEL
Anion gap: 11 (ref 5–15)
BUN: 19 mg/dL (ref 6–20)
CALCIUM: 9.2 mg/dL (ref 8.9–10.3)
CHLORIDE: 103 mmol/L (ref 101–111)
CO2: 24 mmol/L (ref 22–32)
Creatinine, Ser: 0.91 mg/dL (ref 0.44–1.00)
GFR calc Af Amer: >60 mL/min (ref >60)
GFR calc non Af Amer: 58 mL/min — ABNORMAL LOW (ref >60)
Glucose, Bld: 103 mg/dL — ABNORMAL HIGH (ref 65–99)
POTASSIUM: 3.8 mmol/L (ref 3.5–5.1)
SODIUM: 138 mmol/L (ref 135–145)

## 2016-04-09 LAB — CBC
HCT: 37.3 % (ref 35.0–47.0)
Hemoglobin: 12.9 g/dL (ref 12.0–16.0)
MCH: 28.1 pg (ref 26.0–34.0)
MCHC: 34.7 g/dL (ref 32.0–36.0)
MCV: 81.2 fL (ref 80.0–100.0)
PLATELETS: 217 10*3/uL (ref 150–440)
RBC: 4.59 MIL/uL (ref 3.80–5.20)
RDW: 15.1 % — ABNORMAL HIGH (ref 11.5–14.5)
WBC: 10 10*3/uL (ref 3.6–11.0)

## 2016-04-09 LAB — PROTIME-INR
INR: 1.12
PROTHROMBIN TIME: 14.6 s (ref 11.4–15.0)

## 2016-04-09 LAB — TROPONIN I

## 2016-04-09 MED ORDER — SODIUM CHLORIDE 0.9 % IV BOLUS (SEPSIS)
1000.0000 mL | Freq: Once | INTRAVENOUS | Status: AC
  Administered 2016-04-09: 1000 mL via INTRAVENOUS
  Filled 2016-04-09: qty 1000

## 2016-04-09 MED ORDER — FENTANYL CITRATE (PF) 100 MCG/2ML IJ SOLN
50.0000 ug | Freq: Once | INTRAMUSCULAR | Status: DC
Start: 2016-04-09 — End: 2016-04-09

## 2016-04-09 MED ORDER — KETOROLAC TROMETHAMINE 10 MG PO TABS: 10.0000 mg | ORAL_TABLET | Freq: Four times a day (QID) | ORAL | Status: DC | PRN

## 2016-04-09 MED ORDER — ONDANSETRON HCL 4 MG/2ML IJ SOLN
4.0000 mg | Freq: Once | INTRAMUSCULAR | Status: AC
  Administered 2016-04-09: 4 mg via INTRAVENOUS
  Filled 2016-04-09: qty 2

## 2016-04-09 MED ORDER — KETOROLAC TROMETHAMINE 30 MG/ML IJ SOLN
30.0000 mg | Freq: Once | INTRAMUSCULAR | Status: AC
  Administered 2016-04-09: 30 mg via INTRAVENOUS
  Filled 2016-04-09: qty 1
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
## 2016-04-09 NOTE — ED Notes (Signed)
Pt assisted to the bathroom w/o incident. Pt was upset that the call button did not work for her to be able to call the nurse. The call button was reset and taken out of the wall and plugged back in and it now works. Pt was happy and advised that she would call someone if needed.
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
## 2016-04-09 NOTE — ED Notes (Signed)
Pt assisted to the bathroom by Tammy, EDT-P.

## 2016-04-09 NOTE — ED Notes (Signed)
Pt taken to CT at this time.

## 2016-04-09 NOTE — ED Notes (Signed)
MD at bedside. 

## 2016-04-09 NOTE — Discharge Instructions (Signed)

## 2016-04-09 NOTE — ED Notes (Signed)
This RN spoke with patient regarding medications to be given. Pt requests this RN look in the chart at previous medications, states "You have to be careful with what you give me, I don't take much medication, when I take Tylenol, I only take one".

## 2016-04-13 ENCOUNTER — Ambulatory Visit
Admission: RE | Admit: 2016-04-13 | Discharge: 2016-04-13 | Disposition: A | Discharge status: Home / Self Care | Payer: Medicare HMO | Source: Ambulatory Visit | Attending: Family Medicine | Admitting: Family Medicine

## 2016-04-13 ENCOUNTER — Other Ambulatory Visit: Payer: Self-pay | Admitting: Family Medicine

## 2016-04-13 DIAGNOSIS — I700 Atherosclerosis of aorta: Secondary | ICD-10-CM | POA: Insufficient documentation

## 2016-04-13 DIAGNOSIS — R0781 Pleurodynia: Secondary | ICD-10-CM | POA: Diagnosis not present

## 2016-04-20 ENCOUNTER — Emergency Department: Payer: No Typology Code available for payment source

## 2016-04-20 ENCOUNTER — Emergency Department
Admission: EM | Admit: 2016-04-20 | Discharge: 2016-04-20 | Disposition: A | Discharge status: Home / Self Care | Payer: No Typology Code available for payment source | Attending: Emergency Medicine | Admitting: Emergency Medicine

## 2016-04-20 DIAGNOSIS — I10 Essential (primary) hypertension: Secondary | ICD-10-CM | POA: Diagnosis not present

## 2016-04-20 DIAGNOSIS — R0602 Shortness of breath: Secondary | ICD-10-CM | POA: Diagnosis not present

## 2016-04-20 DIAGNOSIS — M1990 Unspecified osteoarthritis, unspecified site: Secondary | ICD-10-CM | POA: Insufficient documentation

## 2016-04-20 DIAGNOSIS — Y939 Activity, unspecified: Secondary | ICD-10-CM | POA: Diagnosis not present

## 2016-04-20 DIAGNOSIS — Y999 Unspecified external cause status: Secondary | ICD-10-CM | POA: Insufficient documentation

## 2016-04-20 DIAGNOSIS — Y92410 Unspecified street and highway as the place of occurrence of the external cause: Secondary | ICD-10-CM | POA: Diagnosis not present

## 2016-04-20 DIAGNOSIS — R1011 Right upper quadrant pain: Secondary | ICD-10-CM | POA: Diagnosis not present

## 2016-04-20 DIAGNOSIS — R109 Unspecified abdominal pain: Secondary | ICD-10-CM

## 2016-04-20 DIAGNOSIS — Z79899 Other long term (current) drug therapy: Secondary | ICD-10-CM | POA: Diagnosis not present

## 2016-04-20 DIAGNOSIS — R079 Chest pain, unspecified: Secondary | ICD-10-CM | POA: Insufficient documentation

## 2016-04-20 LAB — CBC
HEMATOCRIT: 37.8 % (ref 35.0–47.0)
HEMOGLOBIN: 13.2 g/dL (ref 12.0–16.0)
MCH: 28.3 pg (ref 26.0–34.0)
MCHC: 34.8 g/dL (ref 32.0–36.0)
MCV: 81.4 fL (ref 80.0–100.0)
Platelets: 213 10*3/uL (ref 150–440)
RBC: 4.65 MIL/uL (ref 3.80–5.20)
RDW: 15.6 % — AB (ref 11.5–14.5)
WBC: 9.7 10*3/uL (ref 3.6–11.0)

## 2016-04-20 LAB — BASIC METABOLIC PANEL
ANION GAP: 9 (ref 5–15)
BUN: 21 mg/dL — ABNORMAL HIGH (ref 6–20)
CO2: 25 mmol/L (ref 22–32)
Calcium: 9.5 mg/dL (ref 8.9–10.3)
Chloride: 103 mmol/L (ref 101–111)
Creatinine, Ser: 0.9 mg/dL (ref 0.44–1.00)
GFR calc Af Amer: >60 mL/min (ref >60)
GFR, EST NON AFRICAN AMERICAN: 58 mL/min — AB (ref >60)
GLUCOSE: 116 mg/dL — AB (ref 65–99)
POTASSIUM: 4 mmol/L (ref 3.5–5.1)
SODIUM: 137 mmol/L (ref 135–145)

## 2016-04-20 LAB — TROPONIN I

## 2016-04-20 MED ORDER — LIDOCAINE 5 % EX PTCH: 1.0000 | MEDICATED_PATCH | Freq: Two times a day (BID) | CUTANEOUS | Status: DC

## 2016-04-20 NOTE — Discharge Instructions (Signed)
Please seek medical attention for any high fevers, chest pain, shortness of breath, change in behavior, persistent vomiting, bloody stool or any other new or concerning symptoms. ° ° °Chest Wall Pain °Chest wall pain is pain in or around the bones and muscles of your chest. Sometimes, an injury causes this pain. Sometimes, the cause may not be known. This pain may take several weeks or longer to get better. °HOME CARE INSTRUCTIONS  °Pay attention to any changes in your symptoms. Take these actions to help with your pain:  °· Rest as told by your health care provider.   °· Avoid activities that cause pain. These include any activities that use your chest muscles or your abdominal and side muscles to lift heavy items.    °· If directed, apply ice to the painful area: °¨ Put ice in a plastic bag. °¨ Place a towel between your skin and the bag. °¨ Leave the ice on for 20 minutes, 2-3 times per day. °· Take over-the-counter and prescription medicines only as told by your health care provider. °· Do not use tobacco products, including cigarettes, chewing tobacco, and e-cigarettes. If you need help quitting, ask your health care provider. °· Keep all follow-up visits as told by your health care provider. This is important. °SEEK MEDICAL CARE IF: °· You have a fever. °· Your chest pain becomes worse. °· You have new symptoms. °SEEK IMMEDIATE MEDICAL CARE IF: °· You have nausea or vomiting. °· You feel sweaty or light-headed. °· You have a cough with phlegm (sputum) or you cough up blood. °· You develop shortness of breath. °  °This information is not intended to replace advice given to you by your health care provider. Make sure you discuss any questions you have with your health care provider. °  °Document Released: 09/20/2005 Document Revised: 06/11/2015 Document Reviewed: 12/16/2014 °Elsevier Interactive Patient Education ©2016 [employer] ° °
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
## 2016-04-28 ENCOUNTER — Encounter: Payer: Self-pay | Admitting: General Surgery

## 2016-04-30 NOTE — Progress Notes (Signed)
This encounter was created in error - please disregard.

## 2016-07-21 ENCOUNTER — Emergency Department
Admission: EM | Admit: 2016-07-21 | Discharge: 2016-07-21 | Disposition: A | Discharge status: Home / Self Care | Payer: Medicare HMO | Attending: Emergency Medicine | Admitting: Emergency Medicine

## 2016-07-21 ENCOUNTER — Emergency Department: Payer: Medicare HMO

## 2016-07-21 ENCOUNTER — Encounter: Payer: Self-pay | Admitting: Medical Oncology

## 2016-07-21 DIAGNOSIS — J45909 Unspecified asthma, uncomplicated: Secondary | ICD-10-CM | POA: Diagnosis not present

## 2016-07-21 DIAGNOSIS — J449 Chronic obstructive pulmonary disease, unspecified: Secondary | ICD-10-CM | POA: Diagnosis not present

## 2016-07-21 DIAGNOSIS — Z79899 Other long term (current) drug therapy: Secondary | ICD-10-CM | POA: Insufficient documentation

## 2016-07-21 DIAGNOSIS — I10 Essential (primary) hypertension: Secondary | ICD-10-CM | POA: Insufficient documentation

## 2016-07-21 DIAGNOSIS — R0602 Shortness of breath: Secondary | ICD-10-CM | POA: Diagnosis not present

## 2016-07-21 DIAGNOSIS — R0781 Pleurodynia: Secondary | ICD-10-CM | POA: Diagnosis not present

## 2016-07-21 DIAGNOSIS — R0789 Other chest pain: Secondary | ICD-10-CM | POA: Diagnosis present

## 2016-07-21 DIAGNOSIS — R05 Cough: Secondary | ICD-10-CM | POA: Diagnosis not present

## 2016-07-21 HISTORY — DX: Unspecified asthma, uncomplicated: J45.909

## 2016-07-21 HISTORY — DX: Chronic obstructive pulmonary disease, unspecified: J44.9

## 2016-07-21 HISTORY — DX: Other pulmonary embolism without acute cor pulmonale: I26.99

## 2016-07-21 LAB — BASIC METABOLIC PANEL
ANION GAP: 7 (ref 5–15)
BUN: 12 mg/dL (ref 6–20)
CHLORIDE: 105 mmol/L (ref 101–111)
CO2: 29 mmol/L (ref 22–32)
Calcium: 9.6 mg/dL (ref 8.9–10.3)
Creatinine, Ser: 0.75 mg/dL (ref 0.44–1.00)
GFR calc Af Amer: >60 mL/min (ref >60)
GLUCOSE: 131 mg/dL — AB (ref 65–99)
POTASSIUM: 3.7 mmol/L (ref 3.5–5.1)
Sodium: 141 mmol/L (ref 135–145)

## 2016-07-21 LAB — CBC
HEMATOCRIT: 38.8 % (ref 35.0–47.0)
Hemoglobin: 13.2 g/dL (ref 12.0–16.0)
MCH: 27.8 pg (ref 26.0–34.0)
MCHC: 34 g/dL (ref 32.0–36.0)
MCV: 81.7 fL (ref 80.0–100.0)
Platelets: 213 10*3/uL (ref 150–440)
RBC: 4.75 MIL/uL (ref 3.80–5.20)
RDW: 15 % — ABNORMAL HIGH (ref 11.5–14.5)
WBC: 11.1 10*3/uL — AB (ref 3.6–11.0)

## 2016-07-21 LAB — TROPONIN I
Troponin I: <0.03 ng/mL (ref <0.03)
Troponin I: <0.03 ng/mL (ref <0.03)

## 2016-07-21 MED ORDER — TRAMADOL HCL 50 MG PO TABS: 50.0000 mg | ORAL_TABLET | Freq: Four times a day (QID) | ORAL | 0 refills | Status: AC | PRN

## 2016-07-21 MED ORDER — BENZONATATE 100 MG PO CAPS
200.0000 mg | ORAL_CAPSULE | Freq: Once | ORAL | Status: AC
  Administered 2016-07-21: 200 mg via ORAL
  Filled 2016-07-21: qty 2

## 2016-07-21 MED ORDER — ALBUTEROL SULFATE (2.5 MG/3ML) 0.083% IN NEBU
5.0000 mg | INHALATION_SOLUTION | Freq: Once | RESPIRATORY_TRACT | Status: AC
  Administered 2016-07-21: 5 mg via RESPIRATORY_TRACT
  Filled 2016-07-21: qty 6

## 2016-07-21 MED ORDER — TECHNETIUM TO 99M ALBUMIN AGGREGATED
3.8690 | Freq: Once | INTRAVENOUS | Status: AC | PRN
  Administered 2016-07-21: 3.869 via INTRAVENOUS

## 2016-07-21 MED ORDER — TECHNETIUM TC 99M DIETHYLENETRIAME-PENTAACETIC ACID
32.1910 | Freq: Once | INTRAVENOUS | Status: AC | PRN
  Administered 2016-07-21: 32.191 via INTRAVENOUS

## 2016-07-21 MED ORDER — ACETAMINOPHEN 500 MG PO TABS
1000.0000 mg | ORAL_TABLET | Freq: Once | ORAL | Status: AC
  Administered 2016-07-21: 1000 mg via ORAL
  Filled 2016-07-21: qty 2

## 2016-07-21 NOTE — Discharge Instructions (Signed)
You have been seen in the emergency department today for chest pain. Your workup has shown normal results. As we discussed please follow-up with your primary care physician in the next 1-2 days for recheck. Return to the emergency department for any further chest pain, trouble breathing, or any other symptom personally concerning to yourself. °
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
## 2016-07-21 NOTE — ED Triage Notes (Signed)
Pt reports that she has been having pain to her left shoulder/back since Monday, pain causes pt not to be able to take deep breaths in and pt feels sob. No resp distress noted. Pt denies injury. Denies cough.

## 2016-07-21 NOTE — ED Provider Notes (Signed)
-----------------------------------------   6:12 PM on 07/21/2016 -----------------------------------------  VQ scan is negative/low risk. Labs including second troponin are negative. We will discharge the patient home with these he follow-up. I also discussed a low dose of Ultram for discomfort. Patient is agreeable to plan.   Minna AntisKevin Armentha Branagan, MD 07/21/16 [id number]

## 2016-07-21 NOTE — ED Notes (Signed)
No change in symptoms

## 2016-07-28 ENCOUNTER — Other Ambulatory Visit: Payer: Self-pay | Admitting: Family Medicine

## 2016-07-28 DIAGNOSIS — R1084 Generalized abdominal pain: Secondary | ICD-10-CM

## 2016-07-28 DIAGNOSIS — R0789 Other chest pain: Secondary | ICD-10-CM

## 2016-07-28 DIAGNOSIS — R0781 Pleurodynia: Secondary | ICD-10-CM

## 2016-07-30 ENCOUNTER — Ambulatory Visit: Payer: Medicare HMO

## 2016-09-22 ENCOUNTER — Other Ambulatory Visit: Payer: Self-pay | Admitting: Family Medicine

## 2016-09-22 DIAGNOSIS — R1084 Generalized abdominal pain: Secondary | ICD-10-CM

## 2016-10-08 ENCOUNTER — Ambulatory Visit: Admission: RE | Admit: 2016-10-08 | Payer: Medicare HMO | Source: Ambulatory Visit

## 2016-10-14 ENCOUNTER — Ambulatory Visit
Admission: RE | Admit: 2016-10-14 | Discharge: 2016-10-14 | Disposition: A | Discharge status: Home / Self Care | Payer: Medicare HMO | Source: Ambulatory Visit | Attending: Family Medicine | Admitting: Family Medicine

## 2016-10-14 DIAGNOSIS — R1084 Generalized abdominal pain: Secondary | ICD-10-CM | POA: Diagnosis not present

## 2016-10-25 ENCOUNTER — Emergency Department
Admission: EM | Admit: 2016-10-25 | Discharge: 2016-10-25 | Disposition: A | Discharge status: Home / Self Care | Payer: Medicare HMO | Attending: Emergency Medicine | Admitting: Emergency Medicine

## 2016-10-25 ENCOUNTER — Emergency Department: Payer: Medicare HMO

## 2016-10-25 DIAGNOSIS — I10 Essential (primary) hypertension: Secondary | ICD-10-CM | POA: Diagnosis not present

## 2016-10-25 DIAGNOSIS — J449 Chronic obstructive pulmonary disease, unspecified: Secondary | ICD-10-CM | POA: Diagnosis not present

## 2016-10-25 DIAGNOSIS — J45909 Unspecified asthma, uncomplicated: Secondary | ICD-10-CM | POA: Insufficient documentation

## 2016-10-25 DIAGNOSIS — R0602 Shortness of breath: Secondary | ICD-10-CM | POA: Insufficient documentation

## 2016-10-25 DIAGNOSIS — Z5321 Procedure and treatment not carried out due to patient leaving prior to being seen by health care provider: Secondary | ICD-10-CM | POA: Diagnosis not present

## 2016-10-25 DIAGNOSIS — Z79899 Other long term (current) drug therapy: Secondary | ICD-10-CM | POA: Insufficient documentation

## 2016-10-25 LAB — COMPREHENSIVE METABOLIC PANEL
ALT: 17 U/L (ref 14–54)
ANION GAP: 8 (ref 5–15)
AST: 26 U/L (ref 15–41)
Albumin: 4.2 g/dL (ref 3.5–5.0)
Alkaline Phosphatase: 53 U/L (ref 38–126)
BUN: 11 mg/dL (ref 6–20)
CALCIUM: 9 mg/dL (ref 8.9–10.3)
CHLORIDE: 104 mmol/L (ref 101–111)
CO2: 26 mmol/L (ref 22–32)
CREATININE: 0.84 mg/dL (ref 0.44–1.00)
GFR calc Af Amer: >60 mL/min (ref >60)
Glucose, Bld: 109 mg/dL — ABNORMAL HIGH (ref 65–99)
Potassium: 3.7 mmol/L (ref 3.5–5.1)
SODIUM: 138 mmol/L (ref 135–145)
Total Bilirubin: 0.6 mg/dL (ref 0.3–1.2)
Total Protein: 7.4 g/dL (ref 6.5–8.1)

## 2016-10-25 LAB — CBC WITH DIFFERENTIAL/PLATELET
Basophils Absolute: 0 10*3/uL (ref 0–0.1)
Basophils Relative: 0 %
EOS ABS: 0.1 10*3/uL (ref 0–0.7)
EOS PCT: 2 %
HCT: 34.8 % — ABNORMAL LOW (ref 35.0–47.0)
Hemoglobin: 12.2 g/dL (ref 12.0–16.0)
LYMPHS ABS: 1.4 10*3/uL (ref 1.0–3.6)
LYMPHS PCT: 22 %
MCH: 27.9 pg (ref 26.0–34.0)
MCHC: 34.9 g/dL (ref 32.0–36.0)
MCV: 79.9 fL — AB (ref 80.0–100.0)
MONO ABS: 0.6 10*3/uL (ref 0.2–0.9)
MONOS PCT: 9 %
Neutro Abs: 4.5 10*3/uL (ref 1.4–6.5)
Neutrophils Relative %: 67 %
PLATELETS: 209 10*3/uL (ref 150–440)
RBC: 4.36 MIL/uL (ref 3.80–5.20)
RDW: 15.2 % — ABNORMAL HIGH (ref 11.5–14.5)
WBC: 6.6 10*3/uL (ref 3.6–11.0)

## 2016-10-25 NOTE — ED Triage Notes (Signed)
Pt was dx with the flu 12 days ago - pt went to PCP today for the cough and congestion that has not improved over the last 12 days - per PCP she needed to be evaluated for pneumonia

## 2016-10-27 ENCOUNTER — Telehealth: Payer: Self-pay | Admitting: Emergency Medicine
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
## 2017-01-27 ENCOUNTER — Encounter: Payer: Self-pay | Admitting: Occupational Therapy

## 2017-01-27 ENCOUNTER — Ambulatory Visit: Payer: Medicare HMO | Attending: Rheumatology | Admitting: Occupational Therapy

## 2017-01-27 DIAGNOSIS — M79642 Pain in left hand: Secondary | ICD-10-CM | POA: Insufficient documentation

## 2017-01-27 DIAGNOSIS — M25532 Pain in left wrist: Secondary | ICD-10-CM | POA: Diagnosis present

## 2017-01-27 DIAGNOSIS — R278 Other lack of coordination: Secondary | ICD-10-CM | POA: Diagnosis present

## 2017-01-27 DIAGNOSIS — M25531 Pain in right wrist: Secondary | ICD-10-CM | POA: Insufficient documentation

## 2017-01-27 DIAGNOSIS — M79641 Pain in right hand: Secondary | ICD-10-CM | POA: Diagnosis present

## 2017-01-27 DIAGNOSIS — M6281 Muscle weakness (generalized): Secondary | ICD-10-CM | POA: Insufficient documentation

## 2017-01-27 NOTE — Therapy (Signed)
[facility] [address], [place], [address] Phone: [phone]   Fax:  [phone]  Occupational Therapy Evaluation  Patient Details  Name: Brooke Perez MRN: [id number] Date of Birth: Dec 03, 1932 Referring Provider: Dr Leatha Gilding, JR  Encounter Date: 01/27/2017      OT End of Session - 01/27/17 1019    Visit Number 1   Number of Visits 16   Date for OT Re-Evaluation 03/24/17   Authorization Type [employer]   Authorization Time Period G code every 10th visit   Authorization - Visit Number 1   Authorization - Number of Visits 10   OT Start Time [phone]   OT Stop Time 1015   OT Time Calculation (min) 53 min   Equipment Utilized During Treatment Neoprene thumb/wrist splints   Activity Tolerance Patient tolerated treatment well;No increased pain   Behavior During Therapy WFL for tasks assessed/performed      Past Medical History:  Diagnosis Date  . Allergy 2012   ? benzimidazole, IV dye  . Arthritis    since 1997  . Asthma    Per patient.  . Blood in stool   . Breast screening, unspecified   . COPD (chronic obstructive pulmonary disease) (HCC)   . H/O blood clots 2004  . History of cholecystitis 1986  . Hypertension 1997   since 1997  . PE (pulmonary thromboembolism) (HCC)    2004  . Prolapse of intestine   . Special screening for malignant neoplasms, colon     Past Surgical History:  Procedure Laterality Date  . ABDOMINAL HYSTERECTOMY  1968  . broken hand Right 1986  . CHOLECYSTECTOMY  1999   h/o cholecystitis in 1986  . COLON SURGERY  2000  . COLONOSCOPY W/ POLYPECTOMY  12/25/2010   one 5mm semisessile polyp was found in the descending colon  . goiter removal  1965  . TONSILLECTOMY  1954  . TUBAL LIGATION  1968    There were no vitals filed for this visit.      Subjective Assessment - 01/27/17 0927    Subjective  Pt presents for bilateral CTS and states that  she has had "this before, (years ago) and I had therapy and they finally told me it wouldn't do anything and it went away. Now it came back about 2 months ago with a vengence!"   Pertinent History See EPIC for PMH; extensive allergies and medication allergies   Repetition Decreases Symptoms;Increases Symptoms  Helps:[employer] liniment oil. Increases symptoms: writing, housework, doing art makes it worse   Patient Stated Goals Decreased pain in hands, wrists and thumb   Currently in Pain? Yes   Pain Score 8    Pain Location Hand  Hands/wrists/arms   Pain Orientation Right;Left   Pain Descriptors / Indicators Aching;Sore   Pain Type Chronic pain   Pain Onset More than a month ago   Pain Frequency Constant   Aggravating Factors  Writing, housework and art work   Pain Relieving Factors Linement Oil - [employer] (gets online)   Multiple Pain Sites --  See above           [facility] OT Assessment - 01/27/17 0001      Assessment   Diagnosis Bilateral CTS   Referring Provider Dr Leatha Gilding, JR   Onset Date 11/29/16  This episode "About 2 months or more ago"    Prior Therapy Pt reports that she has had previous therapy  for bilateral CTS that was eventually discontinued secondary to lack of progress. She states that the pain from her previous CTS "went away" on it's own and has not returned about 2 months ago or more.     Precautions   Precautions --  None     Restrictions   Weight Bearing Restrictions No   Other Position/Activity Restrictions Pt reports that she generally does not lift greater than 3# and family assist with lifting heavy pots/pans, groceries etc. secondary to "prolapse, I've been doing it this way for years"     Balance Screen   Has the patient fallen in the past 6 months No   Has the patient had a decrease in activity level because of a fear of falling?  No   Is the patient reluctant to leave their home because of a fear of falling?  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   Meal Prep Plans, prepares and serves adequate meals independently  Someone assists with lifting heavy pans, putting in oven etc   Community Mobility Drives own vehicle   Medication Management Is responsible for taking medication in correct dosages at correct time     Mobility   Mobility Status Independent     Written Expression   Dominant Hand Right   Written Experience --  Writing causes increased pain right hand     Activity Tolerance   Activity Tolerance Comments Pt reports decreased activity tolerance; takes increased rest breaks due to pain; family assists lifting items greater than 3#'s.     Cognition   Overall Cognitive Status Within Functional Limits for tasks assessed  "I forget sometimes" WFL for tasks assessed     Observation/Other Assessments   Observations General AROM is WFL, pt w/ h/o arthritis and c/o pain with increased functional activity.     Other Surveys  Select   Outcome Measures PRWHE: Pain= 44/50; Function: 88/100     Sensation   Light Touch Appears Intact  Intact to light touch   Additional Comments Pt reports that hands hurt more at night, and they go numb at night too. Right is worse than left     Coordination   Gross Motor Movements are Fluid and Coordinated Yes   Fine Motor Movements are Fluid and Coordinated No  Pt with osteoarthritis; pain in thumbs/CMC   9 Hole Peg Test --  Coordination Impaired overall - h/o arthritis in fingers and CMC joints     Edema   Edema Min edema noted left volar forearm proximal to elbow in flexor muscle mass area. Pt reports h/o "bursitis"     ROM / Strength   AROM / PROM / Strength AROM;Strength     AROM   Overall AROM  Deficits   Overall AROM Comments Overall decreased fine motor. Gross AROM is WFL's. Arthritis in hands noted and h/o arthritis.     Strength   Overall Strength Deficits   Overall Strength Comments Overall decreased strength. See JAMAR grip assessment. Deferred pinch assessment secondary to pain.     Hand Function   Right Hand Grip (lbs) 22   Left Hand Grip (lbs) 34   Comment Pain at [facility] Right > Left. Pinch assessment deferred secondary to [facility] joint pain. Negative tinels bilaterally; negative phalen's bilaterally. Positive for pain bilaterally resistance to [facility] joints right greater than left. Pain in bilateral fist dorsal compartments.      Pt was issued R and L neoprene thumb/wrist support splints to wear during the day during functional activity and [facility] (remove to wash hands). She was instructed in splinting use, care and precautions (& a handout was issued). Pt's right hand appeared to be a size medium, however pt stated that she "does not like anything to be tight" and therefore a large was issued - especially secondary to thumb IP joint swelling secondary to arthritis. Pt was educated to try and keep splint tight (as there was a gap along her dorsal  wrist/hand) w/o causing numbness, tingling or color/temperature changes. She verbalized understanding of this in clinic.    Pt was also instructed to bring in any splints that she has previously been issued from MD and other therapists in the past. Pt verbalized understanding of this and was given a reminder note as well.                  OT Education - 01/27/17 1017    Education provided Yes   Education Details Pt was instructed in neoprene thumb/wrist splints to assist with giving support to bilateral wrists, thumbs and CMC joints. Pt was educated to use during the day and at [facility] as tolerated. She was also instructed to bring any and all other splints that she has been issued in the past to her next visit. She verbalized understanding of this in clinic today.   Person(s) Educated Patient   Methods Explanation;Demonstration;Verbal cues;Handout   Comprehension Verbalized understanding;Returned demonstration          OT Short Term Goals - 01/27/17 1239      OT SHORT TERM GOAL #1   Title Pt will be Mod I splint use bilateral hands   Time 4   Period Weeks   Status New     OT SHORT TERM GOAL #2   Title Pt will be Mod I HEP bilateral hands   Time 4   Period Weeks   Status New     OT SHORT TERM GOAL #3   Title Pt will be Mod I energy conservation techniques for ADL's and IADL's bilateral UE's   Time 4   Period Weeks   Status New     OT SHORT TERM GOAL #4   Title Pt will be Mod I joint protection techniques to assist with decreased pain and symptoms w/ grooming tasks   Time 4   Period Weeks   Status New  OT Long Term Goals - 01/27/17 1242      OT LONG TERM GOAL #1   Title Pt will be Mod I upgraded HEP bilateral hands/UE's   Time 8   Period Weeks   Status New     OT LONG TERM GOAL #2   Title Pt will demonstrate decreased pain as seen by PRWHE pain score by 10 points or more   Baseline 01/27/17 PRWHE Pain= 44/50   Time 8   Period Weeks   Status  New     OT LONG TERM GOAL #3   Title Pt will demonstrate increased function bilateral hands as seen by improved PRWHE function score by 20 points or more.   Baseline 01/27/17 PRWHE Function score = 88/100   Time 8   Period Weeks   Status New     OT LONG TERM GOAL #4   Title Pt will report Mod I grooming and basic household activities w/o increased pain bilateral wrists/hands while adhering to joint protection techniques.   Time 8   Period Weeks   Status New     OT LONG TERM GOAL #5   Title Pt will report return to leisure activities (artwork and flea market) at [employer] I level using joint protection techniques   Time 8   Period Weeks   Status New               Plan - 01/27/17 1021    Clinical Impression Statement Pt is an 83 y/o RHD female referred with dx: bilateral CTS. She reports that she has had CTS before and has had therapy for this as well (A few years ago).. Current symptoms began about 2 or more months ago now. Pt reports of pain has impacted her ability to perform daily activities related to ADL's, IADL's and artwork. Pt was noted to be negative for provocative testing to bilateral carpal tunnels (- tinels and - phalens noted) but she was positive for pain bilateral CMC joints and AROM & resistance (APL/EPB) related to [facility]. She also has pain her left forearm, flexor muscle mass noted.. She has an extensive PMH and many allergies & is allergic to many medications, please refer to [facility] for complete medical history and details. She should benefit from out-pt hand therapy to assist with ther ex, a home program, splinting, joint protection techniques, pt education, modalities and a/e PRN. She was issued bilateral neoprene thumb/wrist/CMC splints today for joint protection and pain relief. She will bring all other splints that she has been issued in the past to her next appointment.    Rehab Potential Fair   Clinical Impairments Affecting Rehab Potential Pt reports having a h/o  similiar symptoms in the past that therapy did not help   OT Frequency 2x / week   OT Duration 8 weeks   OT Treatment/Interventions Self-care/ADL training;Fluidtherapy;Therapeutic exercise;Energy conservation;Therapeutic exercises;Patient/family education;Therapeutic activities;Splinting;DME and/or AE instruction;Contrast Bath;Parrafin;Ultrasound;Moist Heat;Cryotherapy   Plan Splint check next visit, instruct in home program for gentle active ROM. Consider pulsed [place] CMC/wrists bilaterally.    OT Home Exercise Plan See pt instruction   Consulted and Agree with Plan of Care Patient      Patient will benefit from skilled therapeutic intervention in order to improve the following deficits and impairments:  Decreased activity tolerance, Decreased endurance, Decreased strength, Decreased range of motion, Impaired flexibility, Increased edema, Pain, Impaired UE functional use, Decreased coordination, Decreased knowledge of precautions, Impaired perceived functional ability  Visit Diagnosis: Muscle weakness (generalized) - Plan: Ot  plan of care cert/re-cert  Pain in left hand - Plan: Ot plan of care cert/re-cert  Pain in right hand - Plan: Ot plan of care cert/re-cert  Other lack of coordination - Plan: Ot plan of care cert/re-cert  Pain in left wrist - Plan: Ot plan of care cert/re-cert  Pain in right wrist - Plan: Ot plan of care cert/re-cert      G-Codes - 04-Feb-2017 1250    Functional Assessment Tool Used (Outpatient only) Clinical judgement; grip (R = 22#, L = 34#) assessment and PRWHE (Pain = 44/50 and Function = 88/100).   Functional Limitation Self care   Self Care Current Status [phone]) At least 60 percent but less than 80 percent impaired, limited or restricted   Self Care Goal Status ([id number]) At least 20 percent but less than 40 percent impaired, limited or restricted      Problem List Patient Active Problem List   Diagnosis Date Noted  . Dyspnea 10/29/2013  . H/O blood clots      Charletta Cousin, Amy Dionicio Stall, OTR/L 02/04/2017, 12:57 PM  [facility] SPORTS MEDICINE [address] [place], [address] Phone: [phone]   Fax:  [phone]  Name: JAYANA KOTULA MRN: [id number] Date of Birth: Nov 11, 1932

## 2017-02-02 ENCOUNTER — Ambulatory Visit: Payer: Medicare HMO | Attending: Rheumatology | Admitting: Occupational Therapy

## 2017-02-02 DIAGNOSIS — M79641 Pain in right hand: Secondary | ICD-10-CM | POA: Diagnosis present

## 2017-02-02 DIAGNOSIS — M79642 Pain in left hand: Secondary | ICD-10-CM | POA: Diagnosis present

## 2017-02-02 DIAGNOSIS — R278 Other lack of coordination: Secondary | ICD-10-CM | POA: Insufficient documentation

## 2017-02-02 DIAGNOSIS — M25531 Pain in right wrist: Secondary | ICD-10-CM | POA: Insufficient documentation

## 2017-02-02 DIAGNOSIS — M6281 Muscle weakness (generalized): Secondary | ICD-10-CM | POA: Diagnosis present

## 2017-02-02 DIAGNOSIS — M25532 Pain in left wrist: Secondary | ICD-10-CM | POA: Diagnosis present

## 2017-02-02 NOTE — Patient Instructions (Signed)
Pt to wear CMC neoprene splint for crafts and function  And wrist splints for night time  Use larger joints , avoid tight and sustained grip  To decrease thumb pain

## 2017-02-02 NOTE — Therapy (Signed)
[facility] [address] [place], [address] Phone: [phone]   Fax:  [phone]  Occupational Therapy Treatment  Patient Details  Name: Brooke Perez MRN: [id number] Date of Birth: Jan 10, 1933 Referring Provider: Dr Leatha Gilding, JR  Encounter Date: 02/02/2017      OT End of Session - 02/02/17 1819    Visit Number 2   Number of Visits 16   Date for OT Re-Evaluation 03/24/17   Authorization Type [employer]   Authorization Time Period G code every 10th visit   Authorization - Visit Number 2   Authorization - Number of Visits 10   OT Start Time 1125   OT Stop Time 1217   OT Time Calculation (min) 52 min   Equipment Utilized During Treatment Neoprene thumb CMC splints and wrist splints    Activity Tolerance Patient tolerated treatment well;No increased pain   Behavior During Therapy WFL for tasks assessed/performed      Past Medical History:  Diagnosis Date  . Allergy 2012   ? benzimidazole, IV dye  . Arthritis    since 1997  . Asthma    Per patient.  . Blood in stool   . Breast screening, unspecified   . COPD (chronic obstructive pulmonary disease) (HCC)   . H/O blood clots 2004  . History of cholecystitis 1986  . Hypertension 1997   since 1997  . PE (pulmonary thromboembolism) (HCC)    2004  . Prolapse of intestine   . Special screening for malignant neoplasms, colon     Past Surgical History:  Procedure Laterality Date  . ABDOMINAL HYSTERECTOMY  1968  . broken hand Right 1986  . CHOLECYSTECTOMY  1999   h/o cholecystitis in 1986  . COLON SURGERY  2000  . COLONOSCOPY W/ POLYPECTOMY  12/25/2010   one 5mm semisessile polyp was found in the descending colon  . goiter removal  1965  . TONSILLECTOMY  1954  . TUBAL LIGATION  1968    There were no vitals filed for this visit.      Subjective Assessment - 02/02/17 1128    Subjective  I brought all my splints for you  to look - she told me to bring it - these gloves I wear a lot - but new ones- thumb splints she gave me the velcro was hard to open and close    Patient Stated Goals Decreased pain in hands, wrists and thumb   Currently in Pain? Yes   Pain Score 6    Pain Location Hand   Pain Orientation Right   Pain Descriptors / Indicators Aching;Sore;Tender   Pain Type Chronic pain   Pain Onset More than a month ago     Pt brought all her splints from past in - 8 of them  Fitted pt's wrist splints that MD provided last visit - and ed on correctly using them  And only night time when numbness and pain worse in wrists  R thumb fitted with  CMC neoprene splint for crafts and function  Assess prior and afterwards lat and 3 point grip- had increase strength with CMC splint on  Pt has smaller ones from past - but not support CMC enough  Paraffin bath done to R hand to decrease pain at thumb -pain decrease from 6 to 4 /10   Pt ed on joint protection principles , AE to decrease pain on CMC's  R worse than L  Hand  out provided - pt to implement theser ( Use larger joints , avoid tight and sustained grip  To decrease thumb pain )                   OT Treatments/Exercises (OP) - 02/02/17 0001      RUE Paraffin   Number Minutes Paraffin 10 Minutes   RUE Paraffin Location Hand   Comments to decrease thumb CMC pain                 OT Education - 02/02/17 1819    Education provided Yes   Education Details splint wearing - for what act - and joint protection principles and reviewed    Person(s) Educated Patient   Methods Explanation;Demonstration;Tactile cues;Verbal cues;Handout   Comprehension Verbal cues required;Returned demonstration;Verbalized understanding          OT Short Term Goals - 01/27/17 1239      OT SHORT TERM GOAL #1   Title Pt will be Mod I splint use bilateral hands   Time 4   Period Weeks   Status New     OT SHORT TERM GOAL #2   Title Pt will be Mod I  HEP bilateral hands   Time 4   Period Weeks   Status New     OT SHORT TERM GOAL #3   Title Pt will be Mod I energy conservation techniques for ADL's and IADL's bilateral UE's   Time 4   Period Weeks   Status New     OT SHORT TERM GOAL #4   Title Pt will be Mod I joint protection techniques to assist with decreased pain and symptoms w/ grooming tasks   Time 4   Period Weeks   Status New           OT Long Term Goals - 01/27/17 1242      OT LONG TERM GOAL #1   Title Pt will be Mod I upgraded HEP bilateral hands/UE's   Time 8   Period Weeks   Status New     OT LONG TERM GOAL #2   Title Pt will demonstrate decreased pain as seen by PRWHE pain score by 10 points or more   Baseline 01/27/17 PRWHE Pain= 44/50   Time 8   Period Weeks   Status New     OT LONG TERM GOAL #3   Title Pt will demonstrate increased function bilateral hands as seen by improved PRWHE function score by 20 points or more.   Baseline 01/27/17 PRWHE Function score = 88/100   Time 8   Period Weeks   Status New     OT LONG TERM GOAL #4   Title Pt will report Mod I grooming and basic household activities w/o increased pain bilateral wrists/hands while adhering to joint protection techniques.   Time 8   Period Weeks   Status New     OT LONG TERM GOAL #5   Title Pt will report return to leisure activities (artwork and flea market) at [employer] I level using joint protection techniques   Time 8   Period Weeks   Status New               Plan - 02/02/17 1821    Clinical Impression Statement Pt was fitted with her wrist splints she had before and to wear at night time -and then [facility] neoprene for daytime act use for R hand - isotoner gauntlet to use in kitchen - pt  pain worse in R thumb than L - had increase prehension grip in R with neoprene splint on - pt ed on joint protection - to use this week splints and joint protection - return next week    Rehab Potential Fair   Clinical Impairments Affecting  Rehab Potential Pt reports having a h/o similiar symptoms in the past that therapy did not help   OT Frequency 1x / week   OT Duration 6 weeks   OT Treatment/Interventions Self-care/ADL training;Fluidtherapy;Therapeutic exercise;Energy conservation;Therapeutic exercises;Patient/family education;Therapeutic activities;Splinting;DME and/or AE instruction;Contrast Bath;Parrafin;Ultrasound;Moist Heat;Cryotherapy   Plan assess how doing with splints - [place] pulse over CMC and CT    OT Home Exercise Plan See pt instruction   Consulted and Agree with Plan of Care Patient      Patient will benefit from skilled therapeutic intervention in order to improve the following deficits and impairments:  Decreased activity tolerance, Decreased endurance, Decreased strength, Decreased range of motion, Impaired flexibility, Increased edema, Pain, Impaired UE functional use, Decreased coordination, Decreased knowledge of precautions, Impaired perceived functional ability  Visit Diagnosis: Muscle weakness (generalized)  Pain in left hand  Pain in right hand  Other lack of coordination  Pain in left wrist  Pain in right wrist    Problem List Patient Active Problem List   Diagnosis Date Noted  . Dyspnea 10/29/2013  . H/O blood clots     Oletta Cohn OTR/L,CLT 02/02/2017, 6:24 PM  [facility] PHYSICAL AND SPORTS MEDICINE [address], [place], [address] Phone: [phone]   Fax:  [phone]  Name: Brooke Perez MRN: [id number] Date of Birth: January 06, 1933

## 2017-02-04 ENCOUNTER — Ambulatory Visit: Payer: Medicare HMO | Admitting: Occupational Therapy

## 2017-02-09 ENCOUNTER — Ambulatory Visit: Payer: Medicare HMO | Admitting: Occupational Therapy

## 2017-02-09 DIAGNOSIS — M79642 Pain in left hand: Secondary | ICD-10-CM

## 2017-02-09 DIAGNOSIS — M25532 Pain in left wrist: Secondary | ICD-10-CM

## 2017-02-09 DIAGNOSIS — M79641 Pain in right hand: Secondary | ICD-10-CM

## 2017-02-09 DIAGNOSIS — M6281 Muscle weakness (generalized): Secondary | ICD-10-CM

## 2017-02-09 DIAGNOSIS — R278 Other lack of coordination: Secondary | ICD-10-CM

## 2017-02-09 DIAGNOSIS — M25531 Pain in right wrist: Secondary | ICD-10-CM

## 2017-02-09 NOTE — Patient Instructions (Signed)
Pt to cont to wear CMC neoprene splints on bilateral hands during functional tasks Wrist splints for night time use Use joint protection principles   Heat to decrease stiffness or pain  Tendon glides - gentle 10 reps each  Fisting one to 2 cm foam block only  Pain free range  - DO not FORCE

## 2017-02-09 NOTE — Therapy (Signed)
[facility] PHYSICAL AND SPORTS MEDICINE [address] [place], [place], [address] Phone: [phone]   Fax:  [phone]  Occupational Therapy Treatment  Patient Details  Name: Brooke Perez MRN: [id number] Date of Birth: Jan 19, 1933 Referring Provider: Dr Leatha GildingGeorge W. Kernodle, JR  Encounter Date: 02/09/2017      OT End of Session - 02/09/17 1140    Visit Number 3   Number of Visits 16   Date for OT Re-Evaluation 03/24/17   Authorization Type [employer]   Authorization Time Period G code every 10th visit   Authorization - Visit Number 3   Authorization - Number of Visits 10   OT Start Time 0933   OT Stop Time 1020   OT Time Calculation (min) 47 min   Activity Tolerance Patient tolerated treatment well;No increased pain   Behavior During Therapy WFL for tasks assessed/performed      Past Medical History:  Diagnosis Date  . Allergy 2012   ? benzimidazole, IV dye  . Arthritis    since 1997  . Asthma    Per patient.  . Blood in stool   . Breast screening, unspecified   . COPD (chronic obstructive pulmonary disease) (HCC)   . H/O blood clots 2004  . History of cholecystitis 1986  . Hypertension 1997   since 1997  . PE (pulmonary thromboembolism) (HCC)    2004  . Prolapse of intestine   . Special screening for malignant neoplasms, colon     Past Surgical History:  Procedure Laterality Date  . ABDOMINAL HYSTERECTOMY  1968  . broken hand Right 1986  . CHOLECYSTECTOMY  1999   h/o cholecystitis in 1986  . COLON SURGERY  2000  . COLONOSCOPY W/ POLYPECTOMY  12/25/2010   one 5mm semisessile polyp was found in the descending colon  . goiter removal  1965  . TONSILLECTOMY  1954  . TUBAL LIGATION  1968    There were no vitals filed for this visit.      Subjective Assessment - 02/09/17 1135    Subjective  MY hands feels great - I love this soft thumb splint you gave me on my R hand  -works really good - no pain coming in  - did not had any numbness at nightime this past week - trying to pick objects up with my palms - Can I get soft thumb splint for my L hand too    Patient Stated Goals Decreased pain in hands, wrists and thumb   Currently in Pain? No/denies            [facility] - 02/09/17 0001      Strength   Right Hand Grip (lbs) 26   Left Hand Grip (lbs) 35    [facility]  Assess splint use at home and fitted with L  CMC neoprene splint Pt to cont with splints for  bilateral hands during functional tasks Wrist splints for night time use Use joint protection principles - review with pt again -respect pain with act - change or modify  Act - by avoid tight and sustained grip , and use larger joints   Paraffin done to R hand  Tendon glides done - with blocking intrinsic - stop when feeling pull  Fisting one to 2 cm foam block only  Pain free range  - DO not FORCE Needed v/c and mod A to not flex wrist  2 x day  OT Treatments/Exercises (OP) - 02/09/17 0001      Ultrasound   Ultrasound Location THumb CMC and CT   Ultrasound Parameters 3. at .8 and 20% for 5 min at end of session to decrease pain and CTsymptoms      RUE Paraffin   Number Minutes Paraffin 10 Minutes   RUE Paraffin Location Hand   Comments to decrease pain and increase ROM                 OT Education - 02/09/17 1139    Education provided Yes   Education Details HEP changes and splint wearing - joint protection principles review   Person(s) Educated Patient   Methods Explanation;Demonstration;Tactile cues;Verbal cues;Handout   Comprehension Verbal cues required;Returned demonstration;Verbalized understanding          OT Short Term Goals - 01/27/17 1239      OT SHORT TERM GOAL #1   Title Pt will be Mod I splint use bilateral hands   Time 4   Period Weeks   Status New     OT SHORT TERM GOAL #2   Title Pt will be Mod I HEP bilateral hands   Time 4    Period Weeks   Status New     OT SHORT TERM GOAL #3   Title Pt will be Mod I energy conservation techniques for ADL's and IADL's bilateral UE's   Time 4   Period Weeks   Status New     OT SHORT TERM GOAL #4   Title Pt will be Mod I joint protection techniques to assist with decreased pain and symptoms w/ grooming tasks   Time 4   Period Weeks   Status New           OT Long Term Goals - 01/27/17 1242      OT LONG TERM GOAL #1   Title Pt will be Mod I upgraded HEP bilateral hands/UE's   Time 8   Period Weeks   Status New     OT LONG TERM GOAL #2   Title Pt will demonstrate decreased pain as seen by PRWHE pain score by 10 points or more   Baseline 01/27/17 PRWHE Pain= 44/50   Time 8   Period Weeks   Status New     OT LONG TERM GOAL #3   Title Pt will demonstrate increased function bilateral hands as seen by improved PRWHE function score by 20 points or more.   Baseline 01/27/17 PRWHE Function score = 88/100   Time 8   Period Weeks   Status New     OT LONG TERM GOAL #4   Title Pt will report Mod I grooming and basic household activities w/o increased pain bilateral wrists/hands while adhering to joint protection techniques.   Time 8   Period Weeks   Status New     OT LONG TERM GOAL #5   Title Pt will report return to leisure activities (artwork and flea market) at [employer] I level using joint protection techniques   Time 8   Period Weeks   Status New               Plan - 02/09/17 1140    Clinical Impression Statement Pt show decrease pain in R hand - increase grip this date - pt report CMC neoprene splint helped on R hand -fitted to L hand too - cont with wrists splints for night time - CT symptoms decrease per pt - did not had  any numbness this past week - pt did show decrease R 2nd MC and PIP flexion-  add tendon glides HEP after paraffin - showed increase ROM - pt to cont with updated HEP    Rehab Potential Fair   OT Frequency 1x / week   OT Duration 4 weeks    OT Treatment/Interventions Self-care/ADL training;Fluidtherapy;Therapeutic exercise;Energy conservation;Therapeutic exercises;Patient/family education;Therapeutic activities;Splinting;DME and/or AE instruction;Contrast Bath;Parrafin;Ultrasound;Moist Heat;Cryotherapy   Plan assess splint use, CT symptoms , pain at thumbs - progress in flexion at R 2nd digits flexion    OT Home Exercise Plan See pt instruction   Consulted and Agree with Plan of Care Patient      Patient will benefit from skilled therapeutic intervention in order to improve the following deficits and impairments:  Decreased activity tolerance, Decreased endurance, Decreased strength, Decreased range of motion, Impaired flexibility, Increased edema, Pain, Impaired UE functional use, Decreased coordination, Decreased knowledge of precautions, Impaired perceived functional ability  Visit Diagnosis: Muscle weakness (generalized)  Pain in left hand  Pain in right hand  Other lack of coordination  Pain in left wrist  Pain in right wrist    Problem List Patient Active Problem List   Diagnosis Date Noted  . Dyspnea 10/29/2013  . H/O blood clots     Oletta Cohn OTR/L,CLT 02/09/2017, 11:45 AM  [facility] PHYSICAL AND SPORTS MEDICINE [address] [place], [address] Phone: [phone]   Fax:  [phone]  Name: Brooke Perez MRN: [id number] Date of Birth: 1933-05-03

## 2017-02-15 ENCOUNTER — Ambulatory Visit
Admission: RE | Admit: 2017-02-15 | Discharge: 2017-02-15 | Disposition: A | Discharge status: Home / Self Care | Payer: Medicare HMO | Source: Ambulatory Visit | Attending: Family Medicine | Admitting: Family Medicine

## 2017-02-15 ENCOUNTER — Other Ambulatory Visit: Payer: Self-pay | Admitting: Family Medicine

## 2017-02-15 DIAGNOSIS — M5136 Other intervertebral disc degeneration, lumbar region: Secondary | ICD-10-CM | POA: Diagnosis not present

## 2017-02-15 DIAGNOSIS — K5900 Constipation, unspecified: Secondary | ICD-10-CM | POA: Insufficient documentation

## 2017-02-15 DIAGNOSIS — R1084 Generalized abdominal pain: Secondary | ICD-10-CM

## 2017-02-16 ENCOUNTER — Ambulatory Visit: Payer: Medicare HMO | Admitting: Occupational Therapy

## 2017-02-16 DIAGNOSIS — M25532 Pain in left wrist: Secondary | ICD-10-CM

## 2017-02-16 DIAGNOSIS — M25531 Pain in right wrist: Secondary | ICD-10-CM

## 2017-02-16 DIAGNOSIS — M6281 Muscle weakness (generalized): Secondary | ICD-10-CM | POA: Diagnosis not present

## 2017-02-16 DIAGNOSIS — R278 Other lack of coordination: Secondary | ICD-10-CM

## 2017-02-16 DIAGNOSIS — M79641 Pain in right hand: Secondary | ICD-10-CM

## 2017-02-16 DIAGNOSIS — M79642 Pain in left hand: Secondary | ICD-10-CM

## 2017-02-16 NOTE — Therapy (Signed)
[facility] [address] [place], [address] Phone: [phone]   Fax:  [phone]  Occupational Therapy Treatment  Patient Details  Name: DEIDRE CARINO MRN: [id number] Date of Birth: 1932-11-26 Referring Provider: Dr Leatha Gilding, JR  Encounter Date: 02/16/2017      OT End of Session - 02/16/17 0917    Visit Number 4   Number of Visits 16   Date for OT Re-Evaluation 03/24/17   OT Start Time 0900   OT Stop Time 0940   OT Time Calculation (min) 40 min   Activity Tolerance Patient tolerated treatment well;No increased pain   Behavior During Therapy WFL for tasks assessed/performed      Past Medical History:  Diagnosis Date  . Allergy 2012   ? benzimidazole, IV dye  . Arthritis    since 1997  . Asthma    Per patient.  . Blood in stool   . Breast screening, unspecified   . COPD (chronic obstructive pulmonary disease) (HCC)   . H/O blood clots 2004  . History of cholecystitis 1986  . Hypertension 1997   since 1997  . PE (pulmonary thromboembolism) (HCC)    2004  . Prolapse of intestine   . Special screening for malignant neoplasms, colon     Past Surgical History:  Procedure Laterality Date  . ABDOMINAL HYSTERECTOMY  1968  . broken hand Right 1986  . CHOLECYSTECTOMY  1999   h/o cholecystitis in 1986  . COLON SURGERY  2000  . COLONOSCOPY W/ POLYPECTOMY  12/25/2010   one 5mm semisessile polyp was found in the descending colon  . goiter removal  1965  . TONSILLECTOMY  1954  . TUBAL LIGATION  1968    There were no vitals filed for this visit.      Subjective Assessment - 02/16/17 0903    Subjective  My knees hurting today - limping little -my hands hurting but I used them alot this week to move stuff at home and fleamarket   Patient Stated Goals Decreased pain in hands, wrists and thumb   Currently in Pain? Yes   Pain Score 5    Pain Orientation Right;Left   Pain Descriptors  / Indicators Aching   Pain Type Chronic pain   Pain Onset More than a month ago                      OT Treatments/Exercises (OP) - 02/16/17 0001      Ultrasound   Ultrasound Location THumb CMC R and proximal  L volar forearm    Ultrasound Parameters 3. at 1.0 intensity , 20% 6 min       RUE Paraffin   Number Minutes Paraffin 10 Minutes   RUE Paraffin Location Hand   Comments to decrease pain at [facility]       Paraffin done to R hand prior to decrease pain  Moist heat to L forearm and thumb   Review wearing of splints use at home during lifting , pushing and pulling  Wrist splints at night time and CMC neoprene splints during day  Pt to cont with splints for  bilateral hands during functional tasks  Uses and reinforce  joint protection principles - review with pt again -respect pain with act - change or modify  Act - by avoid tight and sustained grip , and use larger joints    After heat on L done some [place] tools  nr 2 and 4 sweeping and scooping on bilateral volar wrist and proximal volar  forearm on L  Over palm and CT on R-  And done bilateral carpal spreads  Tendon glides done - with blocking intrinsic - stop when feeling pull on R Fisting one to 2 cm foam block only  Pain free range  - DO not FORCE Needed v/c and min A - not to force   2 x day             OT Education - 02/16/17 0917    Education provided Yes   Education Details joint protection and HEP - splint use    Person(s) Educated Patient   Methods Explanation;Demonstration;Tactile cues;Verbal cues   Comprehension Verbal cues required;Returned demonstration;Verbalized understanding          OT Short Term Goals - 01/27/17 1239      OT SHORT TERM GOAL #1   Title Pt will be Mod I splint use bilateral hands   Time 4   Period Weeks   Status New     OT SHORT TERM GOAL #2   Title Pt will be Mod I HEP bilateral hands   Time 4   Period Weeks   Status New     OT SHORT TERM GOAL  #3   Title Pt will be Mod I energy conservation techniques for ADL's and IADL's bilateral UE's   Time 4   Period Weeks   Status New     OT SHORT TERM GOAL #4   Title Pt will be Mod I joint protection techniques to assist with decreased pain and symptoms w/ grooming tasks   Time 4   Period Weeks   Status New           OT Long Term Goals - 01/27/17 1242      OT LONG TERM GOAL #1   Title Pt will be Mod I upgraded HEP bilateral hands/UE's   Time 8   Period Weeks   Status New     OT LONG TERM GOAL #2   Title Pt will demonstrate decreased pain as seen by PRWHE pain score by 10 points or more   Baseline 01/27/17 PRWHE Pain= 44/50   Time 8   Period Weeks   Status New     OT LONG TERM GOAL #3   Title Pt will demonstrate increased function bilateral hands as seen by improved PRWHE function score by 20 points or more.   Baseline 01/27/17 PRWHE Function score = 88/100   Time 8   Period Weeks   Status New     OT LONG TERM GOAL #4   Title Pt will report Mod I grooming and basic household activities w/o increased pain bilateral wrists/hands while adhering to joint protection techniques.   Time 8   Period Weeks   Status New     OT LONG TERM GOAL #5   Title Pt will report return to leisure activities (artwork and flea market) at [employer] using joint protection techniques   Time 8   Period Weeks   Status New               Plan - 02/16/17 [id number]    Clinical Impression Statement Pt was hurting little more this date in bilateral hands - R thumb more than L - and L proximal forearm - report Dr Gavin PottersKernodle said bursitis - pt pain with sup/pro on L this date - reinforce joint protection with pt and pace  herself doing activities at home and use splints as needed    Rehab Potential Fair   Clinical Impairments Affecting Rehab Potential Pt reports having a h/o similiar symptoms in the past that therapy did not help   OT Frequency 1x / week   OT Duration 4 weeks   Plan assess pain  anduse of splints and joint protectoin    OT Home Exercise Plan See pt instruction   Consulted and Agree with Plan of Care Patient      Patient will benefit from skilled therapeutic intervention in order to improve the following deficits and impairments:  Decreased activity tolerance, Decreased endurance, Decreased strength, Decreased range of motion, Impaired flexibility, Increased edema, Pain, Impaired UE functional use, Decreased coordination, Decreased knowledge of precautions, Impaired perceived functional ability  Visit Diagnosis: Muscle weakness (generalized)  Pain in left hand  Pain in right hand  Other lack of coordination  Pain in left wrist  Pain in right wrist    Problem List Patient Active Problem List   Diagnosis Date Noted  . Dyspnea 10/29/2013  . H/O blood clots     Oletta Cohn OTR/L,CLT 02/16/2017, 10:25 AM  [facility] PHYSICAL AND SPORTS MEDICINE [address] [place], [address] Phone: [phone]   Fax:  [phone]  Name: JALEA BRONAUGH MRN: [id number] Date of Birth: [birth year]

## 2017-02-16 NOTE — Patient Instructions (Addendum)
Same HEP  

## 2017-02-23 ENCOUNTER — Ambulatory Visit: Payer: Medicare HMO | Admitting: Occupational Therapy

## 2017-02-23 DIAGNOSIS — M79642 Pain in left hand: Secondary | ICD-10-CM

## 2017-02-23 DIAGNOSIS — M79641 Pain in right hand: Secondary | ICD-10-CM

## 2017-02-23 DIAGNOSIS — M6281 Muscle weakness (generalized): Secondary | ICD-10-CM | POA: Diagnosis not present

## 2017-02-23 DIAGNOSIS — R278 Other lack of coordination: Secondary | ICD-10-CM

## 2017-02-23 DIAGNOSIS — M25532 Pain in left wrist: Secondary | ICD-10-CM

## 2017-02-23 DIAGNOSIS — M25531 Pain in right wrist: Secondary | ICD-10-CM

## 2017-02-23 NOTE — Therapy (Signed)
[facility] [address] [place], [place], [address] Phone: [phone]   Fax:  [phone]  Occupational Therapy Treatment  Patient Details  Name: Brooke Perez MRN: [id number] Date of Birth: 01/04/1933 Referring Provider: Dr Leatha GildingGeorge W. Kernodle, JR  Encounter Date: 02/23/2017      OT End of Session - 02/23/17 1134    Visit Number 5   Number of Visits 5   Date for OT Re-Evaluation 02/23/17   OT Start Time 1100   OT Stop Time 1140   OT Time Calculation (min) 40 min   Activity Tolerance Patient tolerated treatment well   Behavior During Therapy [facility] for tasks assessed/performed      Past Medical History:  Diagnosis Date  . Allergy 2012   ? benzimidazole, IV dye  . Arthritis    since 1997  . Asthma    Per patient.  . Blood in stool   . Breast screening, unspecified   . COPD (chronic obstructive pulmonary disease) (HCC)   . H/O blood clots 2004  . History of cholecystitis 1986  . Hypertension 1997   since 1997  . PE (pulmonary thromboembolism) (HCC)    2004  . Prolapse of intestine   . Special screening for malignant neoplasms, colon     Past Surgical History:  Procedure Laterality Date  . ABDOMINAL HYSTERECTOMY  1968  . broken hand Right 1986  . CHOLECYSTECTOMY  1999   h/o cholecystitis in 1986  . COLON SURGERY  2000  . COLONOSCOPY W/ POLYPECTOMY  12/25/2010   one 5mm semisessile polyp was found in the descending colon  . goiter removal  1965  . TONSILLECTOMY  1954  . TUBAL LIGATION  1968    There were no vitals filed for this visit.      Subjective Assessment - 02/23/17 1106    Subjective  Seen Dr Sherryll BurgerShah neurology and going to have MRI for brain  - did a lot of towels and they are heavy - so hurting today - I love the black splints - soft and help the pain - arthritis is type of chronic condition    Patient Stated Goals Decreased pain in hands, wrists and thumb   Currently in Pain? Yes    Pain Score 3    Pain Location Hand   Pain Orientation Right;Left   Pain Descriptors / Indicators Aching   Pain Type Chronic pain            [facility] OT Assessment - 02/23/17 0001      Strength   Right Hand Grip (lbs) 30   Left Hand Grip (lbs) 25     Right Hand AROM   R Index PIP 0-100 75 Degrees   R Long PIP 0-100 90 Degrees   R Ring PIP 0-100 75 Degrees   R Little PIP 0-100 85 Degrees     Measurements and grip strength  taken See flowsheet  Reviewed with pt joint protection prinicples and modifications - pt report using it  And reviewed splint use  CMC neoprene for daytime use  Wrist splints for night time  After paraffin done _tendon glides for R hand - AAROM  4th and 5th crossing over under 3rd and 2nd -  Need mod V/C to do all of digits - favor 3rd thru 5th              OT Treatments/Exercises (OP) - 02/23/17 0001      RUE Paraffin  Number Minutes Paraffin 10 Minutes   RUE Paraffin Location Hand   Comments t decrease pain and increase ROM                 OT Education - 02/23/17 1251    Education provided Yes   Education Details discharge instructions - HEP , joint protection , splint use    Person(s) Educated Patient   Methods Explanation;Demonstration;Tactile cues;Verbal cues   Comprehension Verbal cues required;Returned demonstration;Verbalized understanding          OT Short Term Goals - 02/23/17 1121      OT SHORT TERM GOAL #1   Title Pt will be Mod I splint use bilateral hands   Status Achieved     OT SHORT TERM GOAL #2   Title Pt will be Mod I HEP bilateral hands   Status Achieved     OT SHORT TERM GOAL #3   Title Pt will be Mod I energy conservation techniques for ADL's and IADL's bilateral UE's   Status Achieved     OT SHORT TERM GOAL #4   Title Pt will be Mod I joint protection techniques to assist with decreased pain and symptoms w/ grooming tasks   Status Achieved           OT Long Term Goals - 02/23/17 1121       OT LONG TERM GOAL #1   Title Pt will be Mod I upgraded HEP bilateral hands/UE's   Status Achieved     OT LONG TERM GOAL #2   Title Pt will demonstrate decreased pain as seen by PRWHE pain score by 10 points or more   Baseline at eval 44/50 adn now 20/50   Status Achieved     OT LONG TERM GOAL #3   Title Pt will demonstrate increased function bilateral hands as seen by improved PRWHE function score by 20 points or more.   Baseline PRWHE at eval function 44/50 and 17.5/50      OT LONG TERM GOAL #4   Title Pt will report Mod I grooming and basic household activities w/o increased pain bilateral wrists/hands while adhering to joint protection techniques.   Status Achieved     OT LONG TERM GOAL #5   Title Pt will report return to leisure activities (artwork and flea market) at [employer] I level using joint protection techniques   Status Achieved               Plan - 02/23/17 1135    Clinical Impression Statement Pt made great progress from [facility] in pain , function on PRWHE, AROM in R hand , splint use , grip and prehension ( depends on her pain ) - pt do have some numbness still at night time - but not every night  - and not same hand - pt discharge with homeprogram    OT Treatment/Interventions Self-care/ADL training;Fluidtherapy;Therapeutic exercise;Energy conservation;Therapeutic exercises;Patient/family education;Therapeutic activities;Splinting;DME and/or AE instruction;Contrast Bath;Parrafin;Ultrasound;Moist Heat;Cryotherapy   Plan pt to cont with discharge HEP - ROM , splint use , paraffin recommend ed,    OT Home Exercise Plan See pt instruction   Consulted and Agree with Plan of Care Patient      Patient will benefit from skilled therapeutic intervention in order to improve the following deficits and impairments:     Visit Diagnosis: Muscle weakness (generalized)  Pain in left hand  Pain in right hand  Other lack of coordination  Pain in left wrist  Pain in right  wrist  Problem List Patient Active Problem List   Diagnosis Date Noted  . Dyspnea 10/29/2013  . H/O blood clots     Oletta Cohn OTR/L,CLT 02/23/2017, 12:56 PM  [facility] PHYSICAL AND SPORTS MEDICINE [address], [place], [address] Phone: [phone]   Fax:  [phone]  Name: Brooke Perez MRN: [id number] Date of Birth: 07/03/1933

## 2017-02-23 NOTE — Patient Instructions (Signed)
Recommend paraffin bath  AROM tendon glides for R hand  Joint protection for CT symptoms and thumb CMC arthritis   Splint use

## 2017-03-08 ENCOUNTER — Other Ambulatory Visit: Payer: Self-pay | Admitting: Neurology

## 2017-03-08 DIAGNOSIS — R4189 Other symptoms and signs involving cognitive functions and awareness: Secondary | ICD-10-CM

## 2017-03-11 ENCOUNTER — Ambulatory Visit
Admission: RE | Admit: 2017-03-11 | Discharge: 2017-03-11 | Disposition: A | Discharge status: Home / Self Care | Payer: Medicare HMO | Source: Ambulatory Visit | Attending: Neurology | Admitting: Neurology

## 2017-03-11 DIAGNOSIS — R4189 Other symptoms and signs involving cognitive functions and awareness: Secondary | ICD-10-CM | POA: Insufficient documentation

## 2017-06-21 ENCOUNTER — Ambulatory Visit: Payer: Medicare HMO | Admitting: General Surgery

## 2017-08-02 ENCOUNTER — Encounter: Payer: Self-pay | Admitting: *Deleted

## 2017-08-22 ENCOUNTER — Other Ambulatory Visit: Payer: Self-pay | Admitting: Student

## 2017-08-22 DIAGNOSIS — M25522 Pain in left elbow: Secondary | ICD-10-CM

## 2017-08-22 DIAGNOSIS — M25822 Other specified joint disorders, left elbow: Secondary | ICD-10-CM

## 2017-08-30 ENCOUNTER — Ambulatory Visit: Payer: Medicare HMO

## 2017-09-06 ENCOUNTER — Ambulatory Visit
Admission: RE | Admit: 2017-09-06 | Discharge: 2017-09-06 | Disposition: A | Discharge status: Home / Self Care | Payer: Medicare HMO | Source: Ambulatory Visit | Attending: Student | Admitting: Student

## 2017-09-06 ENCOUNTER — Other Ambulatory Visit: Payer: Self-pay | Admitting: Student

## 2017-09-06 DIAGNOSIS — M25822 Other specified joint disorders, left elbow: Secondary | ICD-10-CM | POA: Insufficient documentation

## 2017-09-06 DIAGNOSIS — X58XXXA Exposure to other specified factors, initial encounter: Secondary | ICD-10-CM | POA: Insufficient documentation

## 2017-09-06 DIAGNOSIS — M25522 Pain in left elbow: Secondary | ICD-10-CM | POA: Insufficient documentation

## 2017-09-06 DIAGNOSIS — S46222A Laceration of muscle, fascia and tendon of other parts of biceps, left arm, initial encounter: Secondary | ICD-10-CM | POA: Insufficient documentation

## 2017-12-21 ENCOUNTER — Observation Stay
Admission: EM | Admit: 2017-12-21 | Discharge: 2017-12-22 | Disposition: A | Discharge status: Home / Self Care | Payer: Medicare HMO | Attending: Family Medicine | Admitting: Family Medicine

## 2017-12-21 ENCOUNTER — Encounter: Payer: Self-pay | Admitting: Internal Medicine

## 2017-12-21 ENCOUNTER — Other Ambulatory Visit: Payer: Self-pay

## 2017-12-21 ENCOUNTER — Emergency Department: Payer: Medicare HMO

## 2017-12-21 ENCOUNTER — Observation Stay: Payer: Medicare HMO

## 2017-12-21 DIAGNOSIS — R140 Abdominal distension (gaseous): Secondary | ICD-10-CM | POA: Insufficient documentation

## 2017-12-21 DIAGNOSIS — I10 Essential (primary) hypertension: Secondary | ICD-10-CM | POA: Insufficient documentation

## 2017-12-21 DIAGNOSIS — Z8249 Family history of ischemic heart disease and other diseases of the circulatory system: Secondary | ICD-10-CM | POA: Diagnosis not present

## 2017-12-21 DIAGNOSIS — Z825 Family history of asthma and other chronic lower respiratory diseases: Secondary | ICD-10-CM | POA: Insufficient documentation

## 2017-12-21 DIAGNOSIS — R109 Unspecified abdominal pain: Secondary | ICD-10-CM | POA: Insufficient documentation

## 2017-12-21 DIAGNOSIS — Z7722 Contact with and (suspected) exposure to environmental tobacco smoke (acute) (chronic): Secondary | ICD-10-CM | POA: Diagnosis not present

## 2017-12-21 DIAGNOSIS — R0789 Other chest pain: Secondary | ICD-10-CM | POA: Diagnosis not present

## 2017-12-21 DIAGNOSIS — Z86711 Personal history of pulmonary embolism: Secondary | ICD-10-CM | POA: Diagnosis not present

## 2017-12-21 DIAGNOSIS — E785 Hyperlipidemia, unspecified: Secondary | ICD-10-CM | POA: Insufficient documentation

## 2017-12-21 DIAGNOSIS — Z91041 Radiographic dye allergy status: Secondary | ICD-10-CM | POA: Insufficient documentation

## 2017-12-21 DIAGNOSIS — K219 Gastro-esophageal reflux disease without esophagitis: Secondary | ICD-10-CM | POA: Insufficient documentation

## 2017-12-21 DIAGNOSIS — J449 Chronic obstructive pulmonary disease, unspecified: Secondary | ICD-10-CM | POA: Insufficient documentation

## 2017-12-21 DIAGNOSIS — Z79899 Other long term (current) drug therapy: Secondary | ICD-10-CM | POA: Diagnosis not present

## 2017-12-21 DIAGNOSIS — Z86718 Personal history of other venous thrombosis and embolism: Secondary | ICD-10-CM | POA: Diagnosis not present

## 2017-12-21 DIAGNOSIS — R079 Chest pain, unspecified: Secondary | ICD-10-CM | POA: Diagnosis present

## 2017-12-21 DIAGNOSIS — J9811 Atelectasis: Secondary | ICD-10-CM | POA: Insufficient documentation

## 2017-12-21 DIAGNOSIS — E039 Hypothyroidism, unspecified: Secondary | ICD-10-CM | POA: Insufficient documentation

## 2017-12-21 LAB — BASIC METABOLIC PANEL
Anion gap: 8 (ref 5–15)
BUN: 13 mg/dL (ref 6–20)
CALCIUM: 9.6 mg/dL (ref 8.9–10.3)
CO2: 25 mmol/L (ref 22–32)
CREATININE: 0.87 mg/dL (ref 0.44–1.00)
Chloride: 109 mmol/L (ref 101–111)
GFR calc non Af Amer: >60 mL/min (ref >60)
Glucose, Bld: 123 mg/dL — ABNORMAL HIGH (ref 65–99)
Potassium: 3.5 mmol/L (ref 3.5–5.1)
Sodium: 142 mmol/L (ref 135–145)

## 2017-12-21 LAB — TROPONIN I

## 2017-12-21 LAB — CBC
HCT: 39.9 % (ref 35.0–47.0)
Hemoglobin: 13.2 g/dL (ref 12.0–16.0)
MCH: 27.4 pg (ref 26.0–34.0)
MCHC: 33.1 g/dL (ref 32.0–36.0)
MCV: 82.9 fL (ref 80.0–100.0)
PLATELETS: 221 10*3/uL (ref 150–440)
RBC: 4.81 MIL/uL (ref 3.80–5.20)
RDW: 15.2 % — ABNORMAL HIGH (ref 11.5–14.5)
WBC: 8.9 10*3/uL (ref 3.6–11.0)

## 2017-12-21 MED ORDER — ALUM & MAG HYDROXIDE-SIMETH 200-200-20 MG/5ML PO SUSP: 30.0000 mL | Freq: Four times a day (QID) | ORAL | Status: DC | PRN

## 2017-12-21 MED ORDER — ONDANSETRON HCL 4 MG/2ML IJ SOLN: 4.0000 mg | Freq: Four times a day (QID) | INTRAMUSCULAR | Status: DC | PRN

## 2017-12-21 MED ORDER — PANTOPRAZOLE SODIUM 40 MG IV SOLR
40.0000 mg | Freq: Two times a day (BID) | INTRAVENOUS | Status: DC
  Administered 2017-12-21 – 2017-12-22 (×3): 40 mg via INTRAVENOUS
  Filled 2017-12-21 (×3): qty 40

## 2017-12-21 MED ORDER — ASPIRIN 81 MG PO CHEW
324.0000 mg | CHEWABLE_TABLET | Freq: Once | ORAL | Status: AC
  Administered 2017-12-21: 324 mg via ORAL
  Filled 2017-12-21: qty 4

## 2017-12-21 MED ORDER — SODIUM CHLORIDE 0.9 % IV SOLN
INTRAVENOUS | Status: DC
  Administered 2017-12-21 – 2017-12-22 (×2): via INTRAVENOUS

## 2017-12-21 MED ORDER — AMLODIPINE BESYLATE 5 MG PO TABS
2.5000 mg | ORAL_TABLET | Freq: Every day | ORAL | Status: DC
  Administered 2017-12-21 – 2017-12-22 (×2): 2.5 mg via ORAL
  Filled 2017-12-21 (×2): qty 1

## 2017-12-21 MED ORDER — ACETAMINOPHEN 650 MG RE SUPP: 650.0000 mg | Freq: Four times a day (QID) | RECTAL | Status: DC | PRN

## 2017-12-21 MED ORDER — ONDANSETRON HCL 4 MG PO TABS: 4.0000 mg | ORAL_TABLET | Freq: Four times a day (QID) | ORAL | Status: DC | PRN

## 2017-12-21 MED ORDER — FAMOTIDINE 20 MG PO TABS
40.0000 mg | ORAL_TABLET | Freq: Once | ORAL | Status: AC
  Administered 2017-12-21: 40 mg via ORAL
  Filled 2017-12-21: qty 2

## 2017-12-21 MED ORDER — LEVOTHYROXINE SODIUM 50 MCG PO TABS
75.0000 ug | ORAL_TABLET | Freq: Every day | ORAL | Status: DC
  Administered 2017-12-22: 08:00:00 75 ug via ORAL
  Filled 2017-12-21: qty 1

## 2017-12-21 MED ORDER — ALUM & MAG HYDROXIDE-SIMETH 200-200-20 MG/5ML PO SUSP
15.0000 mL | Freq: Once | ORAL | Status: AC
  Administered 2017-12-21: 15 mL via ORAL
  Filled 2017-12-21: qty 30

## 2017-12-21 MED ORDER — ACETAMINOPHEN 325 MG PO TABS
650.0000 mg | ORAL_TABLET | Freq: Four times a day (QID) | ORAL | Status: DC | PRN
  Filled 2017-12-21: qty 2

## 2017-12-21 MED ORDER — ALBUTEROL SULFATE (2.5 MG/3ML) 0.083% IN NEBU
2.5000 mg | INHALATION_SOLUTION | Freq: Four times a day (QID) | RESPIRATORY_TRACT | Status: DC | PRN
  Administered 2017-12-21 – 2017-12-22 (×2): 2.5 mg via RESPIRATORY_TRACT
  Filled 2017-12-21 (×2): qty 3

## 2017-12-21 MED ORDER — ENOXAPARIN SODIUM 40 MG/0.4ML ~~LOC~~ SOLN
40.0000 mg | SUBCUTANEOUS | Status: DC
  Administered 2017-12-21: 40 mg via SUBCUTANEOUS
  Filled 2017-12-21: qty 0.4

## 2017-12-21 NOTE — ED Triage Notes (Signed)
She arrives today from home via EMS  Pt awakened at 0400 with midsternal CP 10/10  Pt got up and placed her PRN O2 at 3L and took one neb with some relief  pt verbalizing "Something feels stuck"  Congested  cough present     6/10 CP at this time   Hx  Chronic bronchitis,  PE in 2004, never smoked

## 2017-12-21 NOTE — Care Management Obs Status (Signed)
MEDICARE OBSERVATION STATUS NOTIFICATION   Patient Details  Name: Brooke Perez MRN: [id number] Date of Birth: 30-Apr-1933   Medicare Observation Status Notification Given:  Yes PC in ED would not allow patient to sign.   Collie SiadAngela Burnice Oestreicher, RN 12/21/2017, 11:35 AM

## 2017-12-21 NOTE — Progress Notes (Signed)
Talked to Dr. Nemiah CommanderKalisetti about patient refused to complete the VQ scan, as patient start coughing and complaints of throat burning when they started the treatment. No other concern at the moment. RN will continue to monitor.

## 2017-12-21 NOTE — ED Provider Notes (Signed)
[facility] Emergency Department Provider Note  ____________________________________________  Time seen: Approximately 10:01 AM  I have reviewed the triage vital signs and the nursing notes.   HISTORY  Chief Complaint Chest Pain; Cough; and Shortness of Breath    HPI Brooke Perez is a 84 y.o. female who is awakened this morning at 4:00 AM with central chest pain described as heaviness, radiating to the neck and back. Worse with breathing, no alleviating factors. Associated shortness of breath. constant, moderate severity.  No vomiting or diaphoresis. Feels like when she had a PE 15 years ago. She does not currently take any antiplatelet or anticoagulant agents. She has an extensive list of medication sensitivities and allergies including ct IV contrast.     Past Medical History:  Diagnosis Date  . Allergy 2012   ? benzimidazole, IV dye  . Arthritis    since 1997  . Asthma    Per patient.  . Blood in stool   . Breast screening, unspecified   . COPD (chronic obstructive pulmonary disease) (HCC)   . H/O blood clots 2004  . History of cholecystitis 1986  . Hypertension 1997   since 1997  . PE (pulmonary thromboembolism) (HCC)    2004  . Prolapse of intestine   . Special screening for malignant neoplasms, colon      Patient Active Problem List   Diagnosis Date Noted  . Dyspnea 10/29/2013  . H/O blood clots      Past Surgical History:  Procedure Laterality Date  . ABDOMINAL HYSTERECTOMY  1968  . broken hand Right 1986  . CHOLECYSTECTOMY  1999   h/o cholecystitis in 1986  . COLON SURGERY  2000  . COLONOSCOPY W/ POLYPECTOMY  12/25/2010   one 5mm semisessile polyp was found in the descending colon  . goiter removal  1965  . TONSILLECTOMY  1954  . TUBAL LIGATION  1968     Prior to Admission medications   Medication Sig Start Date End Date Taking? Authorizing Provider  albuterol (PROVENTIL) (2.5 MG/3ML) 0.083% nebulizer solution Take  2.5 mg by nebulization every 6 (six) hours as needed for wheezing or shortness of breath.   Yes [provider]  albuterol (VENTOLIN HFA) 108 (90 BASE) MCG/ACT inhaler Inhale 2 puffs into the lungs every 6 (six) hours as needed for wheezing or shortness of breath.   Yes [provider]  amLODipine (NORVASC) 5 MG tablet Take 2.5 mg by mouth daily.    Yes [provider]  levothyroxine (SYNTHROID, LEVOTHROID) 75 MCG tablet Take 75 mcg by mouth daily before breakfast.   Yes [provider]  Polyethylene Glycol 3350 (MIRALAX PO) Take 1 Package by mouth 3 (three) times a week.    Yes [provider]     Allergies Alprazolam; Aspirin; Atenolol; Atrovent [ipratropium]; Ciprofloxacin; Clonidine derivatives; Codeine; Contrast media [iodinated diagnostic agents]; Coumadin [warfarin sodium]; Darvon [propoxyphene hcl]; Demerol [meperidine]; Flovent [fluticasone]; Guiatuss [guaifenesin]; Iohexol; Keflex [cephalexin]; Morphine and related; Nitroglycerin; Parafon forte dsc [chlorzoxazone]; Plavix [clopidogrel bisulfate]; Prednisone (pak) [prednisone]; Pyrimethamine; Sulfa antibiotics; and Zithromax [azithromycin]   Family History  Problem Relation Age of Onset  . Emphysema Sister        smoked  . Emphysema Sister        smoked  . Asthma Sister   . Asthma Brother   . Heart disease Mother   . Heart disease Maternal Uncle   . Heart disease Brother     Social History Social History  Tobacco Use  . Smoking status: Never Smoker  . Smokeless tobacco: Never Used  Substance Use Topics  . Alcohol use: No  . Drug use: No    Review of Systems  Constitutional:   No fever or chills.  ENT:   No sore throat. No rhinorrhea. Cardiovascular:   positive as above chest pain without syncope. Respiratory:   positive shortness of breath without cough. Gastrointestinal:   Negative for abdominal pain, vomiting and diarrhea.  Musculoskeletal:   Negative for focal pain  or swelling All other systems reviewed and are negative except as documented above in ROS and HPI.  ____________________________________________   PHYSICAL EXAM:  VITAL SIGNS: ED Triage Vitals  Enc Vitals Group     BP 12/21/17 0730 (!) 134/97     Pulse Rate 12/21/17 0730 85     Resp 12/21/17 0730 17     Temp --      Temp src --      SpO2 12/21/17 0730 97 %     Weight 12/21/17 0735 135 lb (61.2 kg)     Height 12/21/17 0735 5\' 6"  (1.676 m)     Head Circumference --      Peak Flow --      Pain Score 12/21/17 0734 6     Pain Loc --      Pain Edu? --      Excl. in GC? --     Vital signs reviewed, nursing assessments reviewed.   Constitutional:   Alert and oriented. not in distress. Eyes:   No scleral icterus.  EOMI. No nystagmus. No conjunctival pallor. PERRL. ENT   Head:   Normocephalic and atraumatic.   Nose:   No congestion/rhinnorhea.    Mouth/Throat:   MMM, no pharyngeal erythema. No peritonsillar mass.    Neck:   No meningismus. Full ROM. Hematological/Lymphatic/Immunilogical:   No cervical lymphadenopathy. Cardiovascular:   RRR. Symmetric bilateral radial and DP pulses.  No murmurs.  Respiratory:   Normal respiratory effort without tachypnea/retractions. Breath sounds are clear and equal bilaterally. No wheezes/rales/rhonchi. Gastrointestinal:   Soft and nontender. Non distended. There is no CVA tenderness.  No rebound, rigidity, or guarding.positive epigastric tenderness Genitourinary:   deferred Musculoskeletal:   Normal range of motion in all extremities. No joint effusions.  No lower extremity tenderness.  No edema.  right calf circumference greater than left Neurologic:   Normal speech and language.  Motor grossly intact. No acute focal neurologic deficits are appreciated.  Skin:    Skin is warm, dry and intact. No rash noted.  No petechiae, purpura, or bullae.  ____________________________________________    LABS (pertinent  positives/negatives) (all labs ordered are listed, but only abnormal results are displayed) Labs Reviewed  BASIC METABOLIC PANEL - Abnormal; Notable for the following components:      Result Value   Glucose, Bld 123 (*)    All other components within normal limits  CBC - Abnormal; Notable for the following components:   RDW 15.2 (*)    All other components within normal limits  TROPONIN I   ____________________________________________   EKG  interpreted by me Sinus rhythm rate of 97, left axis, normal intervals. Normal QRS ST segments and T waves.  ____________________________________________    RADIOLOGY  Ct Abdomen Pelvis Wo Contrast  Result Date: 12/21/2017 CLINICAL DATA:  84 year old female with acute midsternal chest pain at 0400 hours. Cough and congestion. Abdominal distension and pain. EXAM: CT CHEST, ABDOMEN AND PELVIS WITHOUT CONTRAST TECHNIQUE: Multidetector CT imaging  of the chest, abdomen and pelvis was performed following the standard protocol without IV contrast. COMPARISON:  CT chest abdomen and pelvis 04/09/2016. FINDINGS: CT CHEST FINDINGS Cardiovascular: Chronic ectatic thoracic aorta. The descending thoracic aorta measures up to 33 mm diameter, stable since 2017. Chronic aortic and coronary artery calcified atherosclerosis. Mild cardiomegaly is stable. Vascular patency is not evaluated in the absence of IV contrast. No pericardial effusion. Mediastinum/Nodes: Negative.  No mediastinal lymphadenopathy. Lungs/Pleura: Major airways are patent. Chronic right greater than left basilar segmental lower lobe bronchiectasis (series 4, image 113). Associated chronic peribronchial thickening. Associated regional atelectasis or scarring is also stable. No pleural effusion or acute pulmonary opacity. Musculoskeletal: Stable visualized osseous structures. Chronic benign vertebral hemangiomas in the T5 and T7 levels. CT ABDOMEN PELVIS FINDINGS Hepatobiliary: Stable and negative,  surgically absent gallbladder with prominent CBD. Pancreas: Stable and negative. Spleen: Stable and negative. Adrenals/Urinary Tract: Stable mild adrenal gland thickening such as due to adrenal hyperplasia. Bilateral noncontrast kidneys appear stable. Probable bilateral benign renal parapelvic cysts. No perinephric stranding. No nephrolithiasis. Both ureters are negative. Pelvic floor laxity with prolapse of the urinary bladder, vagina, and rectum. The appearance is mildly progressed since 2017. Stomach/Bowel: Decompressed rectum. Mild to moderate sigmoid diverticulosis without active inflammation. Decompressed left colon. Redundant splenic flexure with mild to moderate retained stool. Similar retained stool in the mildly redundant transverse colon and hepatic flexure. Similar retained stool in the right colon. Diminutive or absent appendix. No large bowel inflammation. Negative terminal ileum. No dilated small bowel. Decompressed stomach and duodenum. Small 2nd portion duodenal diverticulum is unchanged. No mesentery inflammatory stranding identified. No abdominal free air or free fluid. Vascular/Lymphatic: Aortoiliac calcified atherosclerosis. Mild ectasia of the proximal abdominal aorta is stable. There is a chronic fusiform there is chronic fusiform aneurysmal enlargement of the celiac artery trunk, 14-15 millimeters diameter. This is stable. Vascular patency is not evaluated in the absence of IV contrast. No lymphadenopathy. Reproductive: Surgically absent. Other: Chronic postoperative changes along the anterior and right side pelvic floor with several chronic surgical clips. Chronic pelvic floor laxity. No pelvic free fluid. Musculoskeletal: Chronic lumbar disc and endplate degeneration. Stable visualized osseous structures. Benign vertebral hemangioma in the right L4 body suspected and stable. Chronic pubic rami fractures are stable. IMPRESSION: 1. No acute or inflammatory process identified in the  noncontrast chest, abdomen, or pelvis. 2. Chronic Aortic Atherosclerosis (ICD10-I70.0) and ectasia of the thoracic and proximal abdominal aorta. 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 3. Chronic lower lobe Bronchiectasis and scarring. 4. Chronic pelvic floor laxity with prolapse of the urinary bladder, vagina, and rectum appears mildly progressed since 2017. 5. Mild sigmoid diverticulosis. Electronically Signed   By: Odessa Fleming M.D.   On: 12/21/2017 09:22   Dg Chest 2 View  Result Date: 12/21/2017 CLINICAL DATA:  Chest pain and cough for several hours EXAM: CHEST - 2 VIEW COMPARISON:  12/21/2017 FINDINGS: Cardiac shadow is mildly enlarged but accentuated by the portable technique. Aortic atherosclerotic changes are noted. Minimal left basilar atelectasis is seen. No other focal abnormality is noted. IMPRESSION: Minimal left basilar atelectasis. Electronically Signed   By: Alcide Clever M.D.   On: 12/21/2017 09:23   Ct Chest Wo Contrast  Result Date: 12/21/2017 CLINICAL DATA:  84 year old female with acute midsternal chest pain at 0400 hours. Cough and congestion. Abdominal distension and pain. EXAM: CT CHEST, ABDOMEN AND PELVIS WITHOUT CONTRAST TECHNIQUE: Multidetector CT imaging of the chest, abdomen and pelvis  was performed following the standard protocol without IV contrast. COMPARISON:  CT chest abdomen and pelvis 04/09/2016. FINDINGS: CT CHEST FINDINGS Cardiovascular: Chronic ectatic thoracic aorta. The descending thoracic aorta measures up to 33 mm diameter, stable since 2017. Chronic aortic and coronary artery calcified atherosclerosis. Mild cardiomegaly is stable. Vascular patency is not evaluated in the absence of IV contrast. No pericardial effusion. Mediastinum/Nodes: Negative.  No mediastinal lymphadenopathy. Lungs/Pleura: Major airways are patent. Chronic right greater than left basilar segmental lower lobe bronchiectasis (series 4, image 113). Associated  chronic peribronchial thickening. Associated regional atelectasis or scarring is also stable. No pleural effusion or acute pulmonary opacity. Musculoskeletal: Stable visualized osseous structures. Chronic benign vertebral hemangiomas in the T5 and T7 levels. CT ABDOMEN PELVIS FINDINGS Hepatobiliary: Stable and negative, surgically absent gallbladder with prominent CBD. Pancreas: Stable and negative. Spleen: Stable and negative. Adrenals/Urinary Tract: Stable mild adrenal gland thickening such as due to adrenal hyperplasia. Bilateral noncontrast kidneys appear stable. Probable bilateral benign renal parapelvic cysts. No perinephric stranding. No nephrolithiasis. Both ureters are negative. Pelvic floor laxity with prolapse of the urinary bladder, vagina, and rectum. The appearance is mildly progressed since 2017. Stomach/Bowel: Decompressed rectum. Mild to moderate sigmoid diverticulosis without active inflammation. Decompressed left colon. Redundant splenic flexure with mild to moderate retained stool. Similar retained stool in the mildly redundant transverse colon and hepatic flexure. Similar retained stool in the right colon. Diminutive or absent appendix. No large bowel inflammation. Negative terminal ileum. No dilated small bowel. Decompressed stomach and duodenum. Small 2nd portion duodenal diverticulum is unchanged. No mesentery inflammatory stranding identified. No abdominal free air or free fluid. Vascular/Lymphatic: Aortoiliac calcified atherosclerosis. Mild ectasia of the proximal abdominal aorta is stable. There is a chronic fusiform there is chronic fusiform aneurysmal enlargement of the celiac artery trunk, 14-15 millimeters diameter. This is stable. Vascular patency is not evaluated in the absence of IV contrast. No lymphadenopathy. Reproductive: Surgically absent. Other: Chronic postoperative changes along the anterior and right side pelvic floor with several chronic surgical clips. Chronic pelvic  floor laxity. No pelvic free fluid. Musculoskeletal: Chronic lumbar disc and endplate degeneration. Stable visualized osseous structures. Benign vertebral hemangioma in the right L4 body suspected and stable. Chronic pubic rami fractures are stable. IMPRESSION: 1. No acute or inflammatory process identified in the noncontrast chest, abdomen, or pelvis. 2. Chronic Aortic Atherosclerosis (ICD10-I70.0) and ectasia of the thoracic and proximal abdominal aorta. 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 3. Chronic lower lobe Bronchiectasis and scarring. 4. Chronic pelvic floor laxity with prolapse of the urinary bladder, vagina, and rectum appears mildly progressed since 2017. 5. Mild sigmoid diverticulosis. Electronically Signed   By: Odessa FlemingH  Hall M.D.   On: 12/21/2017 09:22   [place] Venous Img Lower Unilateral Right  Result Date: 12/21/2017 CLINICAL DATA:  Leg swelling x1 month, chest pain x4 hours EXAM: RIGHT LOWER EXTREMITY VENOUS DOPPLER ULTRASOUND TECHNIQUE: Gray-scale sonography with compression, as well as color and duplex ultrasound, were performed to evaluate the deep venous system from the level of the common femoral vein through the popliteal and proximal calf veins. COMPARISON:  06/21/2008 FINDINGS: Normal compressibility of the common femoral, superficial femoral, and popliteal veins, as well as the proximal calf veins. No filling defects to suggest DVT on grayscale or color Doppler imaging. Doppler waveforms show normal direction of venous flow, normal respiratory phasicity and response to augmentation. Survey views of the contralateral common femoral vein are unremarkable. IMPRESSION: No evidence of RIGHT lower  extremity deep vein thrombosis. Electronically Signed   By: Corlis Leak M.D.   On: 12/21/2017 09:35    ____________________________________________   PROCEDURES Procedures  ____________________________________________  DIFFERENTIAL DIAGNOSIS    non-STEMI, pulmonary embolism, pneumothorax, GERD  CLINICAL IMPRESSION / ASSESSMENT AND PLAN / ED COURSE  Pertinent labs & imaging results that were available during my care of the patient were reviewed by me and considered in my medical decision making (see chart for details).   patient presents with chest pain, most concerning for ACS versus pulmonary embolism. Given her underlying medical history which includes PE, and the limitations of her iodinated contrast allergy, I will start workup with CT chest abdomen pelvis to quickly rule out certain life-threatening concerns such as aortic aneurysm. However, we will also need a VQ scan to evaluate for pulmonary embolism. I'll also obtain an ultrasound of the right lower extremity to evaluate for DVT. I'm unable to evaluate for dissection or mesenteric ischemia, but I have low suspicion for these entities at this time.  Clinical Course as of Dec 24 915  Wed Dec 21, 2017  0936 Ct chest, abd, pelvis = NAD. No large aneurysm, ptx, pna, pulmonary edema, abd free air   [PS]  0937 Will need further cardiac workup. Initial labs unremarakble. No current evidence of ACS, would not heparinize at this time. Unable to give aspirin or plavix due to stated allergies. Pt does report she took aspirin yesterday without problems. I recommended aspirin today but she refuses unless we have a known problem to treat.    [PS]  1010 RLE [place] negative for DVT   [PS]  1017 patient now reports worsening chest pain and some dizziness. Still sinus rhythm on the monitor. Blood pressure stable. Recommended to the patient that we start heparin given her symptoms, past medical history, and current diagnostic uncertainty. She refuses. She states that she would be willing to take aspirin. She understands my concern that she could be having a heart attack or pulmonary embolism that could be life-threatening or cause organ dysfunction and damage but she is very reluctant to take blood  thinners at this time. She has medical decision-making capacity. I will also offer an acids as she has a history of GERD, but she reports she took tums  already today without relief.   [PS]    Clinical Course User Index [PS] Sharman Cheek, MD       ____________________________________________   FINAL CLINICAL IMPRESSION(S) / ED DIAGNOSES    Final diagnoses:  Chest pain with moderate risk for cardiac etiology     ED Discharge Orders    None      Portions of this note were generated with dragon dictation software. Dictation errors may occur despite best attempts at proofreading.    Sharman Cheek, MD 12/23/17 [phone]

## 2017-12-21 NOTE — ED Notes (Signed)
Pt up to bedside restroom with steady gait noted.

## 2017-12-21 NOTE — Consult Note (Signed)
Cardiology Consultation Note    Patient ID: Brooke Perez, MRN: [id number], DOB/AGE: Mar 09, 1933 84 y.o. Admit date: 12/21/2017   Date of Consult: 12/21/2017 Primary Physician: Hillery Aldo, MD Primary Cardiologist: Dr. Gwen Pounds  Chief Complaint: chest pain Reason for Consultation: chest pain Requesting MD: Dr. Nemiah Commander  HPI: Brooke Perez is a 84 y.o. female with history of chest pain, copd, hypertension, h/o dvt, hypertension who was admitted with progressive chest pain with atypical features. Pain is worse with deep palpation and deep inspiration. She has ruled out for an mi. EKG showed nsr with no ischemia. CXR revealed mild left basilar atelectasis. Chest ct scan revealed no acute process in chest, abdoman or pelvis. She has chronic lower lobe bronchiectasis. She was not able to complete a v/q scan due to coughing. She has ruled out for an mi. She is pain free at present unless deep palpation on chest or deep coughing occurs. She had a stress echo as an outpatient  On 10/18 which revealed normal lv function and no ischemia. Pain is not worsened with exertion.   Past Medical History:  Diagnosis Date  . Allergy 2012   ? benzimidazole, IV dye  . Arthritis    since 1997  . Asthma    Per patient.  . Blood in stool   . Breast screening, unspecified   . COPD (chronic obstructive pulmonary disease) (HCC)   . H/O blood clots 2004  . History of cholecystitis 1986  . Hypertension 1997   since 1997  . PE (pulmonary thromboembolism) (HCC)    2004  . Prolapse of intestine   . Special screening for malignant neoplasms, colon       Surgical History:  Past Surgical History:  Procedure Laterality Date  . ABDOMINAL HYSTERECTOMY  1968  . broken hand Right 1986  . CHOLECYSTECTOMY  1999   h/o cholecystitis in 1986  . COLON SURGERY  2000  . COLONOSCOPY W/ POLYPECTOMY  12/25/2010   one 5mm semisessile polyp was found in the descending colon  . goiter removal  1965  . TONSILLECTOMY   1954  . TUBAL LIGATION  1968     Home Meds: Prior to Admission medications   Medication Sig Start Date End Date Taking? Authorizing Provider  albuterol (PROVENTIL) (2.5 MG/3ML) 0.083% nebulizer solution Take 2.5 mg by nebulization every 6 (six) hours as needed for wheezing or shortness of breath.   Yes [provider]  albuterol (VENTOLIN HFA) 108 (90 BASE) MCG/ACT inhaler Inhale 2 puffs into the lungs every 6 (six) hours as needed for wheezing or shortness of breath.   Yes [provider]  amLODipine (NORVASC) 5 MG tablet Take 2.5 mg by mouth daily.    Yes [provider]  levothyroxine (SYNTHROID, LEVOTHROID) 75 MCG tablet Take 75 mcg by mouth daily before breakfast.   Yes [provider]  Polyethylene Glycol 3350 (MIRALAX PO) Take 1 Package by mouth 3 (three) times a week.    Yes [provider]    Inpatient Medications:  . amLODipine  2.5 mg Oral Daily  . enoxaparin (LOVENOX) injection  40 mg Subcutaneous Q24H  . [START ON 12/22/2017] levothyroxine  75 mcg Oral QAC breakfast  . pantoprazole (PROTONIX) IV  40 mg Intravenous Q12H   . sodium chloride 60 mL/hr at 12/21/17 1244    Allergies:  Allergies  Allergen Reactions  . Alprazolam     unknown  . Aspirin     unknown  . Atenolol  unknown  . Atrovent [Ipratropium]     unknown  . Ciprofloxacin     unknown  . Clonidine Derivatives     unknown  . Codeine     unknown  . Contrast Media [Iodinated Diagnostic Agents]     unknown  . Coumadin [Warfarin Sodium]     unknown  . Darvon [Propoxyphene Hcl]     unknown  . Demerol [Meperidine]     unknown  . Flovent [Fluticasone]     unknown  . Guiatuss [Guaifenesin]     unknown  . Iohexol     unknown  . Keflex [Cephalexin]     unknown  . Morphine And Related     unknown  . Nitroglycerin     unknown  . Parafon Forte Dsc [Chlorzoxazone]     unknown  . Plavix [Clopidogrel Bisulfate]     unknown  . Prednisone (Pak)  [Prednisone] Other (See Comments)    coughing  . Pyrimethamine     unknown  . Sulfa Antibiotics     unknown  . Zithromax [Azithromycin]     unknown    Social History   Socioeconomic History  . Marital status: Married    Spouse name: Not on file  . Number of children: 2  . Years of education: Not on file  . Highest education level: Not on file  Social Needs  . Financial resource strain: Not on file  . Food insecurity - worry: Not on file  . Food insecurity - inability: Not on file  . Transportation needs - medical: Not on file  . Transportation needs - non-medical: Not on file  Occupational History  . 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 Smoking status: Never Smoker  . Smokeless tobacco: Never Used  Substance and Sexual Activity  . Alcohol use: No    Comment: never alcoholic  . Drug use: No  . Sexual activity: Not on file  Other Topics Concern  . Not on file  Social History Narrative   Lives at home with husband, independent at baseline     Family History  Problem Relation Age of Onset  . Emphysema Sister        smoked  . Emphysema Sister        smoked  . Asthma Sister   . Asthma Brother   . Heart disease Mother   . Heart disease Maternal Uncle   . Heart disease Brother      Review of Systems: A 12-system review of systems was performed and is negative except as noted in the HPI.  Labs: Recent Labs    12/21/17 0739 12/21/17 1241  TROPONINI <0.03 <0.03   Lab Results  Component Value Date   WBC 8.9 12/21/2017   HGB 13.2 12/21/2017   HCT 39.9 12/21/2017   MCV 82.9 12/21/2017   PLT 221 12/21/2017    Recent Labs  Lab 12/21/17 0739  NA 142  K 3.5  CL 109  CO2 25  BUN 13  CREATININE 0.87  CALCIUM 9.6  GLUCOSE 123*   No results found for: CHOL, HDL, LDLCALC, TRIG No results found for: DDIMER  Radiology/Studies:  Ct Abdomen Pelvis Wo Contrast  Result Date: 12/21/2017 CLINICAL  DATA:  84 year old female with acute midsternal chest pain at 0400 hours. Cough and congestion. Abdominal distension and pain. EXAM: CT CHEST, ABDOMEN AND PELVIS WITHOUT CONTRAST TECHNIQUE: Multidetector CT imaging of the  chest, abdomen and pelvis was performed following the standard protocol without IV contrast. COMPARISON:  CT chest abdomen and pelvis 04/09/2016. FINDINGS: CT CHEST FINDINGS Cardiovascular: Chronic ectatic thoracic aorta. The descending thoracic aorta measures up to 33 mm diameter, stable since 2017. Chronic aortic and coronary artery calcified atherosclerosis. Mild cardiomegaly is stable. Vascular patency is not evaluated in the absence of IV contrast. No pericardial effusion. Mediastinum/Nodes: Negative.  No mediastinal lymphadenopathy. Lungs/Pleura: Major airways are patent. Chronic right greater than left basilar segmental lower lobe bronchiectasis (series 4, image 113). Associated chronic peribronchial thickening. Associated regional atelectasis or scarring is also stable. No pleural effusion or acute pulmonary opacity. Musculoskeletal: Stable visualized osseous structures. Chronic benign vertebral hemangiomas in the T5 and T7 levels. CT ABDOMEN PELVIS FINDINGS Hepatobiliary: Stable and negative, surgically absent gallbladder with prominent CBD. Pancreas: Stable and negative. Spleen: Stable and negative. Adrenals/Urinary Tract: Stable mild adrenal gland thickening such as due to adrenal hyperplasia. Bilateral noncontrast kidneys appear stable. Probable bilateral benign renal parapelvic cysts. No perinephric stranding. No nephrolithiasis. Both ureters are negative. Pelvic floor laxity with prolapse of the urinary bladder, vagina, and rectum. The appearance is mildly progressed since 2017. Stomach/Bowel: Decompressed rectum. Mild to moderate sigmoid diverticulosis without active inflammation. Decompressed left colon. Redundant splenic flexure with mild to moderate retained stool. Similar  retained stool in the mildly redundant transverse colon and hepatic flexure. Similar retained stool in the right colon. Diminutive or absent appendix. No large bowel inflammation. Negative terminal ileum. No dilated small bowel. Decompressed stomach and duodenum. Small 2nd portion duodenal diverticulum is unchanged. No mesentery inflammatory stranding identified. No abdominal free air or free fluid. Vascular/Lymphatic: Aortoiliac calcified atherosclerosis. Mild ectasia of the proximal abdominal aorta is stable. There is a chronic fusiform there is chronic fusiform aneurysmal enlargement of the celiac artery trunk, 14-15 millimeters diameter. This is stable. Vascular patency is not evaluated in the absence of IV contrast. No lymphadenopathy. Reproductive: Surgically absent. Other: Chronic postoperative changes along the anterior and right side pelvic floor with several chronic surgical clips. Chronic pelvic floor laxity. No pelvic free fluid. Musculoskeletal: Chronic lumbar disc and endplate degeneration. Stable visualized osseous structures. Benign vertebral hemangioma in the right L4 body suspected and stable. Chronic pubic rami fractures are stable. IMPRESSION: 1. No acute or inflammatory process identified in the noncontrast chest, abdomen, or pelvis. 2. Chronic Aortic Atherosclerosis (ICD10-I70.0) and ectasia of the thoracic and proximal abdominal aorta. 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 3. Chronic lower lobe Bronchiectasis and scarring. 4. Chronic pelvic floor laxity with prolapse of the urinary bladder, vagina, and rectum appears mildly progressed since 2017. 5. Mild sigmoid diverticulosis. Electronically Signed   By: Odessa Fleming M.D.   On: 12/21/2017 09:22   Dg Chest 2 View  Result Date: 12/21/2017 CLINICAL DATA:  Chest pain and cough for several hours EXAM: CHEST - 2 VIEW COMPARISON:  12/21/2017 FINDINGS: Cardiac shadow is mildly enlarged but accentuated by  the portable technique. Aortic atherosclerotic changes are noted. Minimal left basilar atelectasis is seen. No other focal abnormality is noted. IMPRESSION: Minimal left basilar atelectasis. Electronically Signed   By: Alcide Clever M.D.   On: 12/21/2017 09:23   Ct Chest Wo Contrast  Result Date: 12/21/2017 CLINICAL DATA:  84 year old female with acute midsternal chest pain at 0400 hours. Cough and congestion. Abdominal distension and pain. EXAM: CT CHEST, ABDOMEN AND PELVIS WITHOUT CONTRAST TECHNIQUE: Multidetector CT imaging of the chest, abdomen and pelvis was performed  following the standard protocol without IV contrast. COMPARISON:  CT chest abdomen and pelvis 04/09/2016. FINDINGS: CT CHEST FINDINGS Cardiovascular: Chronic ectatic thoracic aorta. The descending thoracic aorta measures up to 33 mm diameter, stable since 2017. Chronic aortic and coronary artery calcified atherosclerosis. Mild cardiomegaly is stable. Vascular patency is not evaluated in the absence of IV contrast. No pericardial effusion. Mediastinum/Nodes: Negative.  No mediastinal lymphadenopathy. Lungs/Pleura: Major airways are patent. Chronic right greater than left basilar segmental lower lobe bronchiectasis (series 4, image 113). Associated chronic peribronchial thickening. Associated regional atelectasis or scarring is also stable. No pleural effusion or acute pulmonary opacity. Musculoskeletal: Stable visualized osseous structures. Chronic benign vertebral hemangiomas in the T5 and T7 levels. CT ABDOMEN PELVIS FINDINGS Hepatobiliary: Stable and negative, surgically absent gallbladder with prominent CBD. Pancreas: Stable and negative. Spleen: Stable and negative. Adrenals/Urinary Tract: Stable mild adrenal gland thickening such as due to adrenal hyperplasia. Bilateral noncontrast kidneys appear stable. Probable bilateral benign renal parapelvic cysts. No perinephric stranding. No nephrolithiasis. Both ureters are negative. Pelvic floor  laxity with prolapse of the urinary bladder, vagina, and rectum. The appearance is mildly progressed since 2017. Stomach/Bowel: Decompressed rectum. Mild to moderate sigmoid diverticulosis without active inflammation. Decompressed left colon. Redundant splenic flexure with mild to moderate retained stool. Similar retained stool in the mildly redundant transverse colon and hepatic flexure. Similar retained stool in the right colon. Diminutive or absent appendix. No large bowel inflammation. Negative terminal ileum. No dilated small bowel. Decompressed stomach and duodenum. Small 2nd portion duodenal diverticulum is unchanged. No mesentery inflammatory stranding identified. No abdominal free air or free fluid. Vascular/Lymphatic: Aortoiliac calcified atherosclerosis. Mild ectasia of the proximal abdominal aorta is stable. There is a chronic fusiform there is chronic fusiform aneurysmal enlargement of the celiac artery trunk, 14-15 millimeters diameter. This is stable. Vascular patency is not evaluated in the absence of IV contrast. No lymphadenopathy. Reproductive: Surgically absent. Other: Chronic postoperative changes along the anterior and right side pelvic floor with several chronic surgical clips. Chronic pelvic floor laxity. No pelvic free fluid. Musculoskeletal: Chronic lumbar disc and endplate degeneration. Stable visualized osseous structures. Benign vertebral hemangioma in the right L4 body suspected and stable. Chronic pubic rami fractures are stable. IMPRESSION: 1. No acute or inflammatory process identified in the noncontrast chest, abdomen, or pelvis. 2. Chronic Aortic Atherosclerosis (ICD10-I70.0) and ectasia of the thoracic and proximal abdominal aorta. 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 3. Chronic lower lobe Bronchiectasis and scarring. 4. Chronic pelvic floor laxity with prolapse of the urinary bladder, vagina, and rectum appears mildly progressed  since 2017. 5. Mild sigmoid diverticulosis. Electronically Signed   By: Odessa Fleming M.D.   On: 12/21/2017 09:22   US Venous Img Lower Unilateral Right  Result Date: 12/21/2017 CLINICAL DATA:  Leg swelling x1 month, chest pain x4 hours EXAM: RIGHT LOWER EXTREMITY VENOUS DOPPLER ULTRASOUND TECHNIQUE: Gray-scale sonography with compression, as well as color and duplex ultrasound, were performed to evaluate the deep venous system from the level of the common femoral vein through the popliteal and proximal calf veins. COMPARISON:  06/21/2008 FINDINGS: Normal compressibility of the common femoral, superficial femoral, and popliteal veins, as well as the proximal calf veins. No filling defects to suggest DVT on grayscale or color Doppler imaging. Doppler waveforms show normal direction of venous flow, normal respiratory phasicity and response to augmentation. Survey views of the contralateral common femoral vein are unremarkable. IMPRESSION: No evidence of RIGHT lower extremity deep  vein thrombosis. Electronically Signed   By: Corlis Leak  Hassell M.D.   On: 12/21/2017 09:35    Wt Readings from Last 3 Encounters:  12/21/17 64.6 kg (142 lb 6.4 oz)  10/25/16 62.1 kg (137 lb)  07/21/16 62.6 kg (138 lb)    EKG: nsr with no ischemia  Physical Exam:  Blood pressure (!) 155/80, pulse 64, temperature 97.7 F (36.5 C), temperature source Oral, resp. rate 18, height 5\' 6"  (1.676 m), weight 64.6 kg (142 lb 6.4 oz), SpO2 96 %. Body mass index is 22.98 kg/m. General: Well developed, well nourished, in no acute distress. Head: Normocephalic, atraumatic, sclera non-icteric, no xanthomas, nares are without discharge.  Neck: Negative for carotid bruits. JVD not elevated. Lungs: Bilateral rhonchi . No rales. Heart: RRR with S1 S2. No murmurs, rubs, or gallops appreciated. Abdomen: Soft, non-tender, non-distended with normoactive bowel sounds. No hepatomegaly. No rebound/guarding. No obvious abdominal masses. Msk:  Strength and  tone appear normal for age. Extremities: No clubbing or cyanosis. No edema.  Distal pedal pulses are 2+ and equal bilaterally. Neuro: Alert and oriented X 3. No facial asymmetry. No focal deficit. Moves all extremities spontaneously. Psych:  Responds to questions appropriately with a normal affect.     Assessment and Plan  84 yo female with history of copd, hypertension, hyperlipidemia and atypical chest pain who also has history of cvt in the past. She hs ruled out for an mi. V/Q was not possible due to noncompliance. She had a negative stress echo less than 1 year ago. Her symptoms are atypical for angina. She is currently stable. Would not proceed with invasive evaluation or repeat functional study at this time. Would ambulate and consider discharge if stable. Continue with current meds.   Signed, Dalia HeadingKenneth A Jaice Lague MD 12/21/2017, 7:09 PM Pager: [phone]

## 2017-12-21 NOTE — Care Management Note (Signed)
Case Management Note  Patient Details  Name: Brooke Perez MRN: [id number] Date of Birth: 09/06/1933  Subjective/Objective:                  RNCM met with patient to discuss [place] letter. Patient is independent from home with her husband. She has home O2 through [place] however she states she only wears it PRN. PCP with Princella Ion.  She denies problems with medications as she has Medicaid.  Action/Plan: No identified RNCM needs at this time.  Expected Discharge Date:                  Expected Discharge Plan:     In-House Referral:     Discharge planning Services     Post Acute Care Choice:    Choice offered to:     DME Arranged:    DME Agency:     HH Arranged:    HH Agency:     Status of Service:     If discussed at [employer], dates discussed:    Additional Comments:  Marshell Garfinkel, RN 12/21/2017, 11:36 AM

## 2017-12-21 NOTE — H&P (Signed)
[facility] [place] [facility]   PATIENT NAME: Brooke Perez    MR#:  [id number]  DATE OF BIRTH:  10-23-1932  DATE OF ADMISSION:  12/21/2017  PRIMARY CARE PHYSICIAN: Hillery Aldo, MD   REQUESTING/REFERRING PHYSICIAN: Dr. Sharman Cheek  CHIEF COMPLAINT:   Chief Complaint  Patient presents with  . Chest Pain  . Cough  . Shortness of Breath    HISTORY OF PRESENT ILLNESS:  Brooke Perez  is a 84 y.o. female with a known history of COPD, asthma on PRN  home oxygen, nonsmoker, GERD, history of PE not on anticoagulation at this time, arthritis presents to hospital secondary to worsening chest pain. Patient states her pain started around 4 AM and woke her up from sleep. She felt as if something was stuck in her throat and it was radiating to her left jaw and abdomen. Also felt nauseous and felt like she was going to pass out. Denies any diaphoresis. Patient has been having ongoing chest pain occasionally for which she has seen a cardiologist and stress test done about 4-5 months ago was normal. Also has significant reflux disease but does not take any proton pump inhibitors due to multiple side effects to medications. Uses Tums as needed which she did this morning with no improvement in her pain. EKG without any acute findings here.Convinced to take aspirin without significant relief in her pain. Tried Maalox with some relief at this time. First set of troponins is negative. She is being admitted under observation.  PAST MEDICAL HISTORY:   Past Medical History:  Diagnosis Date  . Allergy 2012   ? benzimidazole, IV dye  . Arthritis    since 1997  . Asthma    Per patient.  . Blood in stool   . Breast screening, unspecified   . COPD (chronic obstructive pulmonary disease) (HCC)   . H/O blood clots 2004  . History of cholecystitis 1986  . Hypertension 1997   since 1997  . PE (pulmonary thromboembolism) (HCC)    2004  . Prolapse of intestine   . Special  screening for malignant neoplasms, colon     PAST SURGICAL HISTORY:   Past Surgical History:  Procedure Laterality Date  . ABDOMINAL HYSTERECTOMY  1968  . broken hand Right 1986  . CHOLECYSTECTOMY  1999   h/o cholecystitis in 1986  . COLON SURGERY  2000  . COLONOSCOPY W/ POLYPECTOMY  12/25/2010   one 5mm semisessile polyp was found in the descending colon  . goiter removal  1965  . TONSILLECTOMY  1954  . TUBAL LIGATION  1968    SOCIAL HISTORY:   Social History   Tobacco Use  . Smoking status: Never Smoker  . Smokeless tobacco: Never Used  Substance Use Topics  . Alcohol use: No    Comment: never alcoholic    FAMILY HISTORY:   Family History  Problem Relation Age of Onset  . Emphysema Sister        smoked  . Emphysema Sister        smoked  . Asthma Sister   . Asthma Brother   . Heart disease Mother   . Heart disease Maternal Uncle   . Heart disease Brother     DRUG ALLERGIES:   Allergies  Allergen Reactions  . Alprazolam     unknown  . Aspirin     unknown  . Atenolol     unknown  . Atrovent [Ipratropium]     unknown  .  Ciprofloxacin     unknown  . Clonidine Derivatives     unknown  . Codeine     unknown  . Contrast Media [Iodinated Diagnostic Agents]     unknown  . Coumadin [Warfarin Sodium]     unknown  . Darvon [Propoxyphene Hcl]     unknown  . Demerol [Meperidine]     unknown  . Flovent [Fluticasone]     unknown  . Guiatuss [Guaifenesin]     unknown  . Iohexol     unknown  . Keflex [Cephalexin]     unknown  . Morphine And Related     unknown  . Nitroglycerin     unknown  . Parafon Forte Dsc [Chlorzoxazone]     unknown  . Plavix [Clopidogrel Bisulfate]     unknown  . Prednisone (Pak) [Prednisone] Other (See Comments)    coughing  . Pyrimethamine     unknown  . Sulfa Antibiotics     unknown  . Zithromax [Azithromycin]     unknown    REVIEW OF SYSTEMS:   Review of Systems  Constitutional: Positive for malaise/fatigue.  Negative for chills, fever and weight loss.  HENT: Negative for ear discharge, ear pain, hearing loss and nosebleeds.   Eyes: Negative for blurred vision, double vision and photophobia.  Respiratory: Negative for cough, hemoptysis, shortness of breath and wheezing.   Cardiovascular: Positive for chest pain. Negative for palpitations, orthopnea and leg swelling.  Gastrointestinal: Positive for heartburn and nausea. Negative for abdominal pain, constipation, diarrhea, melena and vomiting.  Genitourinary: Negative for dysuria and urgency.  Musculoskeletal: Positive for myalgias. Negative for back pain and neck pain.  Skin: Negative for rash.  Neurological: Negative for dizziness, tingling, sensory change, speech change, focal weakness and headaches.  Endo/Heme/Allergies: Does not bruise/bleed easily.  Psychiatric/Behavioral: Negative for depression.    MEDICATIONS AT HOME:   Prior to Admission medications   Medication Sig Start Date End Date Taking? Authorizing Provider  albuterol (PROVENTIL) (2.5 MG/3ML) 0.083% nebulizer solution Take 2.5 mg by nebulization every 6 (six) hours as needed for wheezing or shortness of breath.   Yes [provider]  albuterol (VENTOLIN HFA) 108 (90 BASE) MCG/ACT inhaler Inhale 2 puffs into the lungs every 6 (six) hours as needed for wheezing or shortness of breath.   Yes [provider]  amLODipine (NORVASC) 5 MG tablet Take 2.5 mg by mouth daily.    Yes [provider]  levothyroxine (SYNTHROID, LEVOTHROID) 75 MCG tablet Take 75 mcg by mouth daily before breakfast.   Yes [provider]  Polyethylene Glycol 3350 (MIRALAX PO) Take 1 Package by mouth 3 (three) times a week.    Yes [provider]      VITAL SIGNS:  Blood pressure (!) 141/88, pulse 85, resp. rate 13, height 5\' 6"  (1.676 m), weight 61.2 kg (135 lb), SpO2 97 %.  PHYSICAL EXAMINATION:   Physical Exam  GENERAL:  84 y.o.-year-old thin built patient  lying in the bed with no acute distress.  EYES: Pupils equal, round, reactive to light and accommodation. No scleral icterus. Extraocular muscles intact.  HEENT: Head atraumatic, normocephalic. Oropharynx and nasopharynx clear.  NECK:  Supple, no jugular venous distention. No thyroid enlargement, no tenderness.  LUNGS: Normal breath sounds bilaterally, no wheezing, rales,rhonchi or crepitation. No use of accessory muscles of respiration. Decreased bibasilar breath sounds CARDIOVASCULAR: S1, S2 normal. No murmurs, rubs, or gallops. No chest wall tenderness ABDOMEN: Soft, nontender, nondistended. Bowel sounds present. No organomegaly or  mass.  EXTREMITIES: No pedal edema, cyanosis, or clubbing.  NEUROLOGIC: Cranial nerves II through XII are intact. Muscle strength 5/5 in all extremities. Sensation intact. Gait not checked.  PSYCHIATRIC: The patient is alert and oriented x 3.  SKIN: No obvious rash, lesion, or ulcer.   LABORATORY PANEL:   CBC Recent Labs  Lab 12/21/17 0739  WBC 8.9  HGB 13.2  HCT 39.9  PLT 221   ------------------------------------------------------------------------------------------------------------------  Chemistries  Recent Labs  Lab 12/21/17 0739  NA 142  K 3.5  CL 109  CO2 25  GLUCOSE 123*  BUN 13  CREATININE 0.87  CALCIUM 9.6   ------------------------------------------------------------------------------------------------------------------  Cardiac Enzymes Recent Labs  Lab 12/21/17 0739  TROPONINI <0.03   ------------------------------------------------------------------------------------------------------------------  RADIOLOGY:  Ct Abdomen Pelvis Wo Contrast  Result Date: 12/21/2017 CLINICAL DATA:  84 year old female with acute midsternal chest pain at 0400 hours. Cough and congestion. Abdominal distension and pain. EXAM: CT CHEST, ABDOMEN AND PELVIS WITHOUT CONTRAST TECHNIQUE: Multidetector CT imaging of the chest, abdomen and pelvis was  performed following the standard protocol without IV contrast. COMPARISON:  CT chest abdomen and pelvis 04/09/2016. FINDINGS: CT CHEST FINDINGS Cardiovascular: Chronic ectatic thoracic aorta. The descending thoracic aorta measures up to 33 mm diameter, stable since 2017. Chronic aortic and coronary artery calcified atherosclerosis. Mild cardiomegaly is stable. Vascular patency is not evaluated in the absence of IV contrast. No pericardial effusion. Mediastinum/Nodes: Negative.  No mediastinal lymphadenopathy. Lungs/Pleura: Major airways are patent. Chronic right greater than left basilar segmental lower lobe bronchiectasis (series 4, image 113). Associated chronic peribronchial thickening. Associated regional atelectasis or scarring is also stable. No pleural effusion or acute pulmonary opacity. Musculoskeletal: Stable visualized osseous structures. Chronic benign vertebral hemangiomas in the T5 and T7 levels. CT ABDOMEN PELVIS FINDINGS Hepatobiliary: Stable and negative, surgically absent gallbladder with prominent CBD. Pancreas: Stable and negative. Spleen: Stable and negative. Adrenals/Urinary Tract: Stable mild adrenal gland thickening such as due to adrenal hyperplasia. Bilateral noncontrast kidneys appear stable. Probable bilateral benign renal parapelvic cysts. No perinephric stranding. No nephrolithiasis. Both ureters are negative. Pelvic floor laxity with prolapse of the urinary bladder, vagina, and rectum. The appearance is mildly progressed since 2017. Stomach/Bowel: Decompressed rectum. Mild to moderate sigmoid diverticulosis without active inflammation. Decompressed left colon. Redundant splenic flexure with mild to moderate retained stool. Similar retained stool in the mildly redundant transverse colon and hepatic flexure. Similar retained stool in the right colon. Diminutive or absent appendix. No large bowel inflammation. Negative terminal ileum. No dilated small bowel. Decompressed stomach and  duodenum. Small 2nd portion duodenal diverticulum is unchanged. No mesentery inflammatory stranding identified. No abdominal free air or free fluid. Vascular/Lymphatic: Aortoiliac calcified atherosclerosis. Mild ectasia of the proximal abdominal aorta is stable. There is a chronic fusiform there is chronic fusiform aneurysmal enlargement of the celiac artery trunk, 14-15 millimeters diameter. This is stable. Vascular patency is not evaluated in the absence of IV contrast. No lymphadenopathy. Reproductive: Surgically absent. Other: Chronic postoperative changes along the anterior and right side pelvic floor with several chronic surgical clips. Chronic pelvic floor laxity. No pelvic free fluid. Musculoskeletal: Chronic lumbar disc and endplate degeneration. Stable visualized osseous structures. Benign vertebral hemangioma in the right L4 body suspected and stable. Chronic pubic rami fractures are stable. IMPRESSION: 1. No acute or inflammatory process identified in the noncontrast chest, abdomen, or pelvis. 2. Chronic Aortic Atherosclerosis (ICD10-I70.0) and ectasia of the thoracic and proximal abdominal aorta. 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 3. Chronic lower lobe Bronchiectasis and scarring. 4. Chronic pelvic floor laxity with prolapse of the urinary bladder, vagina, and rectum appears mildly progressed since 2017. 5. Mild sigmoid diverticulosis. Electronically Signed   By: Odessa Fleming M.D.   On: 12/21/2017 09:22   Dg Chest 2 View  Result Date: 12/21/2017 CLINICAL DATA:  Chest pain and cough for several hours EXAM: CHEST - 2 VIEW COMPARISON:  12/21/2017 FINDINGS: Cardiac shadow is mildly enlarged but accentuated by the portable technique. Aortic atherosclerotic changes are noted. Minimal left basilar atelectasis is seen. No other focal abnormality is noted. IMPRESSION: Minimal left basilar atelectasis. Electronically Signed   By: Alcide Clever M.D.   On: 12/21/2017  09:23   Ct Chest Wo Contrast  Result Date: 12/21/2017 CLINICAL DATA:  84 year old female with acute midsternal chest pain at 0400 hours. Cough and congestion. Abdominal distension and pain. EXAM: CT CHEST, ABDOMEN AND PELVIS WITHOUT CONTRAST TECHNIQUE: Multidetector CT imaging of the chest, abdomen and pelvis was performed following the standard protocol without IV contrast. COMPARISON:  CT chest abdomen and pelvis 04/09/2016. FINDINGS: CT CHEST FINDINGS Cardiovascular: Chronic ectatic thoracic aorta. The descending thoracic aorta measures up to 33 mm diameter, stable since 2017. Chronic aortic and coronary artery calcified atherosclerosis. Mild cardiomegaly is stable. Vascular patency is not evaluated in the absence of IV contrast. No pericardial effusion. Mediastinum/Nodes: Negative.  No mediastinal lymphadenopathy. Lungs/Pleura: Major airways are patent. Chronic right greater than left basilar segmental lower lobe bronchiectasis (series 4, image 113). Associated chronic peribronchial thickening. Associated regional atelectasis or scarring is also stable. No pleural effusion or acute pulmonary opacity. Musculoskeletal: Stable visualized osseous structures. Chronic benign vertebral hemangiomas in the T5 and T7 levels. CT ABDOMEN PELVIS FINDINGS Hepatobiliary: Stable and negative, surgically absent gallbladder with prominent CBD. Pancreas: Stable and negative. Spleen: Stable and negative. Adrenals/Urinary Tract: Stable mild adrenal gland thickening such as due to adrenal hyperplasia. Bilateral noncontrast kidneys appear stable. Probable bilateral benign renal parapelvic cysts. No perinephric stranding. No nephrolithiasis. Both ureters are negative. Pelvic floor laxity with prolapse of the urinary bladder, vagina, and rectum. The appearance is mildly progressed since 2017. Stomach/Bowel: Decompressed rectum. Mild to moderate sigmoid diverticulosis without active inflammation. Decompressed left colon. Redundant  splenic flexure with mild to moderate retained stool. Similar retained stool in the mildly redundant transverse colon and hepatic flexure. Similar retained stool in the right colon. Diminutive or absent appendix. No large bowel inflammation. Negative terminal ileum. No dilated small bowel. Decompressed stomach and duodenum. Small 2nd portion duodenal diverticulum is unchanged. No mesentery inflammatory stranding identified. No abdominal free air or free fluid. Vascular/Lymphatic: Aortoiliac calcified atherosclerosis. Mild ectasia of the proximal abdominal aorta is stable. There is a chronic fusiform there is chronic fusiform aneurysmal enlargement of the celiac artery trunk, 14-15 millimeters diameter. This is stable. Vascular patency is not evaluated in the absence of IV contrast. No lymphadenopathy. Reproductive: Surgically absent. Other: Chronic postoperative changes along the anterior and right side pelvic floor with several chronic surgical clips. Chronic pelvic floor laxity. No pelvic free fluid. Musculoskeletal: Chronic lumbar disc and endplate degeneration. Stable visualized osseous structures. Benign vertebral hemangioma in the right L4 body suspected and stable. Chronic pubic rami fractures are stable. IMPRESSION: 1. No acute or inflammatory process identified in the noncontrast chest, abdomen, or pelvis. 2. Chronic Aortic Atherosclerosis (ICD10-I70.0) and ectasia of the thoracic and proximal abdominal aorta. 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 3. Chronic lower lobe  Bronchiectasis and scarring. 4. Chronic pelvic floor laxity with prolapse of the urinary bladder, vagina, and rectum appears mildly progressed since 2017. 5. Mild sigmoid diverticulosis. Electronically Signed   By: Odessa Fleming M.D.   On: 12/21/2017 09:22   US Venous Img Lower Unilateral Right  Result Date: 12/21/2017 CLINICAL DATA:  Leg swelling x1 month, chest pain x4 hours EXAM: RIGHT LOWER  EXTREMITY VENOUS DOPPLER ULTRASOUND TECHNIQUE: Gray-scale sonography with compression, as well as color and duplex ultrasound, were performed to evaluate the deep venous system from the level of the common femoral vein through the popliteal and proximal calf veins. COMPARISON:  06/21/2008 FINDINGS: Normal compressibility of the common femoral, superficial femoral, and popliteal veins, as well as the proximal calf veins. No filling defects to suggest DVT on grayscale or color Doppler imaging. Doppler waveforms show normal direction of venous flow, normal respiratory phasicity and response to augmentation. Survey views of the contralateral common femoral vein are unremarkable. IMPRESSION: No evidence of RIGHT lower extremity deep vein thrombosis. Electronically Signed   By: Corlis Leak M.D.   On: 12/21/2017 09:35    EKG:   Orders placed or performed during the hospital encounter of 12/21/17  . EKG 12-Lead  . EKG 12-Lead  . ED EKG within 10 minutes  . ED EKG within 10 minutes  . EKG 12-Lead  . EKG 12-Lead    IMPRESSION AND PLAN:   Brooke Perez  is a 84 y.o. female with a known history of COPD, asthma on PRN  home oxygen, nonsmoker, GERD, history of PE not on anticoagulation at this time, arthritis presents to hospital secondary to worsening chest pain.  1.Chest pain- could be from GERD, non cardiac causes - admit, monitor on tele, troponins - cardiac evaluation - stress ECHO negative 5 months ago - monitor- treat with maalox and protonix - refusing aspirin and heparin - VQ scan ordered due to history of pulmonary embolism in the past. -CT of the chest and abdomen without contrast have been without any acute findings.  2. GERD-started on IV Protonix and Maalox. Multiple medication allergies noted.  3. Chronic bronchitis, asthma, COPD,-no personal history of smoking-but exposed to passive smoking. -At home uses when necessary not o2-continue for now. -continue duo nebs as  needed  4.hypothyroidism-continue Synthroid  5. DVT prophylaxis-Lovenox   All the records are reviewed and case discussed with ED provider. Management plans discussed with the patient, family and they are in agreement.  CODE STATUS: Full Code  TOTAL TIME TAKING CARE OF THIS PATIENT: 50 minutes.    Brooke Perez M.D on 12/21/2017 at 11:03 AM  Between 7am to 6pm - Pager - [phone]  After 6pm go to [email or web address] - Social research officer, government  [facility]  Office  [phone]  CC: Primary care physician; Hillery Aldo, MD

## 2017-12-22 DIAGNOSIS — R0789 Other chest pain: Secondary | ICD-10-CM | POA: Diagnosis not present

## 2017-12-22 LAB — BASIC METABOLIC PANEL
Anion gap: 8 (ref 5–15)
BUN: 11 mg/dL (ref 6–20)
CALCIUM: 8.8 mg/dL — AB (ref 8.9–10.3)
CO2: 25 mmol/L (ref 22–32)
CREATININE: 0.85 mg/dL (ref 0.44–1.00)
Chloride: 109 mmol/L (ref 101–111)
GFR calc Af Amer: >60 mL/min (ref >60)
GLUCOSE: 141 mg/dL — AB (ref 65–99)
Potassium: 3.5 mmol/L (ref 3.5–5.1)
Sodium: 142 mmol/L (ref 135–145)

## 2017-12-22 LAB — CBC
HCT: 35.9 % (ref 35.0–47.0)
Hemoglobin: 12.1 g/dL (ref 12.0–16.0)
MCH: 27.8 pg (ref 26.0–34.0)
MCHC: 33.6 g/dL (ref 32.0–36.0)
MCV: 82.8 fL (ref 80.0–100.0)
PLATELETS: 199 10*3/uL (ref 150–440)
RBC: 4.33 MIL/uL (ref 3.80–5.20)
RDW: 15.2 % — AB (ref 11.5–14.5)
WBC: 8.5 10*3/uL (ref 3.6–11.0)

## 2017-12-22 LAB — TROPONIN I

## 2017-12-22 MED ORDER — PANTOPRAZOLE SODIUM 40 MG PO TBEC: 40.0000 mg | DELAYED_RELEASE_TABLET | Freq: Every day | ORAL | 1 refills | Status: DC

## 2017-12-22 NOTE — Progress Notes (Signed)
Went over discharge instructions with the patient  Including medication and follow-up appointment. Discontinue peripheral IV and telemetry monitor. Advertising account plannerCalled volunteer for transport.

## 2017-12-22 NOTE — Discharge Summary (Signed)
[facility] [place] [facility]   PATIENT NAME: Brooke Perez    MR#:  [id number]  DATE OF BIRTH:  [birth year]  DATE OF ADMISSION:  12/21/2017 ADMITTING PHYSICIAN: Enid Baas, MD  DATE OF DISCHARGE: No discharge date for patient encounter.  PRIMARY CARE PHYSICIAN: Hillery Aldo, MD    ADMISSION DIAGNOSIS:  Chest pain with moderate risk for cardiac etiology [R07.9]  DISCHARGE DIAGNOSIS:  Active Problems:   Chest pain   SECONDARY DIAGNOSIS:   Past Medical History:  Diagnosis Date  . Allergy 2012   ? benzimidazole, IV dye  . Arthritis    since 1997  . Asthma    Per patient.  . Blood in stool   . Breast screening, unspecified   . COPD (chronic obstructive pulmonary disease) (HCC)   . H/O blood clots 2004  . History of cholecystitis 1986  . Hypertension 1997   since 1997  . PE (pulmonary thromboembolism) (HCC)    2004  . Prolapse of intestine   . Special screening for malignant neoplasms, colon     HOSPITAL COURSE:  Brooke Perez  is a 84 y.o. female with a known history of COPD, asthma on PRN  home oxygen, nonsmoker, GERD, history of PE not on anticoagulation at this time, arthritis presents to hospital secondary to worsening chest pain.  1.acute atypical chest pain  Most consistent with GERD Patient was referred to the observation unit, did rule out for acute coronary syndrome, had refused aspirin and heparin, stress echocardiogram done 5 months ago was a negative study, CT of the chest/abdomen without contrast negative for any acute process, cardiology did see patient while in house-no intervention recommended, patient is now ready for discharge home with follow-up with primary care provider for reevaluation to 3 days  2. GERD Suspected acute exacerbation Treated with IV Protonix and Maalox with good results  3. Chronic bronchitis, asthma, COPD without exacerbation Stable Provided breathing treatments as  needed  4.hypothyroidism, unspecified Continued Synthroid  DISCHARGE CONDITIONS:  On the day of discharge patient is afebrile, hemodynamically stable, chest pain-free, ready for discharge home with follow-up with primary care provider, for more specific details please see chart  CONSULTS OBTAINED:  Treatment Team:  Dalia Heading, MD  DRUG ALLERGIES:   Allergies  Allergen Reactions  . Alprazolam     unknown  . Aspirin     unknown  . Atenolol     unknown  . Atrovent [Ipratropium]     unknown  . Ciprofloxacin     unknown  . Clonidine Derivatives     unknown  . Codeine     unknown  . Contrast Media [Iodinated Diagnostic Agents]     unknown  . Coumadin [Warfarin Sodium]     unknown  . Darvon [Propoxyphene Hcl]     unknown  . Demerol [Meperidine]     unknown  . Flovent [Fluticasone]     unknown  . Guiatuss [Guaifenesin]     unknown  . Iohexol     unknown  . Keflex [Cephalexin]     unknown  . Morphine And Related     unknown  . Nitroglycerin     unknown  . Parafon Forte Dsc [Chlorzoxazone]     unknown  . Plavix [Clopidogrel Bisulfate]     unknown  . Prednisone (Pak) [Prednisone] Other (See Comments)    coughing  . Pyrimethamine     unknown  . Sulfa Antibiotics     unknown  . Zithromax [  Azithromycin]     unknown    DISCHARGE MEDICATIONS:   Allergies as of 12/22/2017      Reactions   Alprazolam    unknown   Aspirin    unknown   Atenolol    unknown   Atrovent [ipratropium]    unknown   Ciprofloxacin    unknown   Clonidine Derivatives    unknown   Codeine    unknown   Contrast Media [iodinated Diagnostic Agents]    unknown   Coumadin [warfarin Sodium]    unknown   Darvon [propoxyphene Hcl]    unknown   Demerol [meperidine]    unknown   Flovent [fluticasone]    unknown   Guiatuss [guaifenesin]    unknown   Iohexol    unknown   Keflex [cephalexin]    unknown   Morphine And Related    unknown   Nitroglycerin    unknown    Parafon Forte Dsc [chlorzoxazone]    unknown   Plavix [clopidogrel Bisulfate]    unknown   Prednisone (pak) [prednisone] Other (See Comments)   coughing   Pyrimethamine    unknown   Sulfa Antibiotics    unknown   Zithromax [azithromycin]    unknown      Medication List    TAKE these medications   albuterol (2.5 MG/3ML) 0.083% nebulizer solution Commonly known as:  PROVENTIL Take 2.5 mg by nebulization every 6 (six) hours as needed for wheezing or shortness of breath.   VENTOLIN HFA 108 (90 Base) MCG/ACT inhaler Generic drug:  albuterol Inhale 2 puffs into the lungs every 6 (six) hours as needed for wheezing or shortness of breath.   amLODipine 5 MG tablet Commonly known as:  NORVASC Take 2.5 mg by mouth daily.   levothyroxine 75 MCG tablet Commonly known as:  SYNTHROID, LEVOTHROID Take 75 mcg by mouth daily before breakfast.   MIRALAX PO Take 1 Package by mouth 3 (three) times a week.   pantoprazole 40 MG tablet Commonly known as:  PROTONIX Take 1 tablet (40 mg total) by mouth daily.        DISCHARGE INSTRUCTIONS:   If you experience worsening of your admission symptoms, develop shortness of breath, life threatening emergency, suicidal or homicidal thoughts you must seek medical attention immediately by calling 911 or calling your MD immediately  if symptoms less severe.  You Must read complete instructions/literature along with all the possible adverse reactions/side effects for all the Medicines you take and that have been prescribed to you. Take any new Medicines after you have completely understood and accept all the possible adverse reactions/side effects.   Please note  You were cared for by a hospitalist during your hospital stay. If you have any questions about your discharge medications or the care you received while you were in the hospital after you are discharged, you can call the unit and asked to speak with the hospitalist on call if the hospitalist  that took care of you is not available. Once you are discharged, your primary care physician will handle any further medical issues. Please note that NO REFILLS for any discharge medications will be authorized once you are discharged, as it is imperative that you return to your primary care physician (or establish a relationship with a primary care physician if you do not have one) for your aftercare needs so that they can reassess your need for medications and monitor your lab values.    Today   CHIEF COMPLAINT:  Chief Complaint  Patient presents with  . Chest Pain  . Cough  . Shortness of Breath    HISTORY OF PRESENT ILLNESS:  84 y.o. female with a known history of COPD, asthma on PRN  home oxygen, nonsmoker, GERD, history of PE not on anticoagulation at this time, arthritis presents to hospital secondary to worsening chest pain. Patient states her pain started around 4 AM and woke her up from sleep. She felt as if something was stuck in her throat and it was radiating to her left jaw and abdomen. Also felt nauseous and felt like she was going to pass out. Denies any diaphoresis. Patient has been having ongoing chest pain occasionally for which she has seen a cardiologist and stress test done about 4-5 months ago was normal. Also has significant reflux disease but does not take any proton pump inhibitors due to multiple side effects to medications. Uses Tums as needed which she did this morning with no improvement in her pain. EKG without any acute findings here.Convinced to take aspirin without significant relief in her pain. Tried Maalox with some relief at this time. First set of troponins is negative. She is being admitted under observation.  VITAL SIGNS:  Blood pressure (!) 143/80, pulse 72, temperature 97.7 F (36.5 C), temperature source Oral, resp. rate 18, height 5\' 6"  (1.676 m), weight 64.6 kg (142 lb 6.4 oz), SpO2 96 %.  I/O:    Intake/Output Summary (Last 24 hours) at  12/22/2017 1039 Last data filed at 12/22/2017 1009 Gross per 24 hour  Intake 540 ml  Output 1850 ml  Net -1310 ml    PHYSICAL EXAMINATION:  GENERAL:  84 y.o.-year-old patient lying in the bed with no acute distress.  EYES: Pupils equal, round, reactive to light and accommodation. No scleral icterus. Extraocular muscles intact.  HEENT: Head atraumatic, normocephalic. Oropharynx and nasopharynx clear.  NECK:  Supple, no jugular venous distention. No thyroid enlargement, no tenderness.  LUNGS: Normal breath sounds bilaterally, no wheezing, rales,rhonchi or crepitation. No use of accessory muscles of respiration.  CARDIOVASCULAR: S1, S2 normal. No murmurs, rubs, or gallops.  ABDOMEN: Soft, non-tender, non-distended. Bowel sounds present. No organomegaly or mass.  EXTREMITIES: No pedal edema, cyanosis, or clubbing.  NEUROLOGIC: Cranial nerves II through XII are intact. Muscle strength 5/5 in all extremities. Sensation intact. Gait not checked.  PSYCHIATRIC: The patient is alert and oriented x 3.  SKIN: No obvious rash, lesion, or ulcer.   DATA REVIEW:   CBC Recent Labs  Lab 12/22/17 0011  WBC 8.5  HGB 12.1  HCT 35.9  PLT 199    Chemistries  Recent Labs  Lab 12/22/17 0011  NA 142  K 3.5  CL 109  CO2 25  GLUCOSE 141*  BUN 11  CREATININE 0.85  CALCIUM 8.8*    Cardiac Enzymes Recent Labs  Lab 12/22/17 0011  TROPONINI <0.03    Microbiology Results  No results found for this or any previous visit.  RADIOLOGY:  Ct Abdomen Pelvis Wo Contrast  Result Date: 12/21/2017 CLINICAL DATA:  84 year old female with acute midsternal chest pain at 0400 hours. Cough and congestion. Abdominal distension and pain. EXAM: CT CHEST, ABDOMEN AND PELVIS WITHOUT CONTRAST TECHNIQUE: Multidetector CT imaging of the chest, abdomen and pelvis was performed following the standard protocol without IV contrast. COMPARISON:  CT chest abdomen and pelvis 04/09/2016. FINDINGS: CT CHEST FINDINGS  Cardiovascular: Chronic ectatic thoracic aorta. The descending thoracic aorta measures up to 33 mm diameter, stable since 2017. Chronic  aortic and coronary artery calcified atherosclerosis. Mild cardiomegaly is stable. Vascular patency is not evaluated in the absence of IV contrast. No pericardial effusion. Mediastinum/Nodes: Negative.  No mediastinal lymphadenopathy. Lungs/Pleura: Major airways are patent. Chronic right greater than left basilar segmental lower lobe bronchiectasis (series 4, image 113). Associated chronic peribronchial thickening. Associated regional atelectasis or scarring is also stable. No pleural effusion or acute pulmonary opacity. Musculoskeletal: Stable visualized osseous structures. Chronic benign vertebral hemangiomas in the T5 and T7 levels. CT ABDOMEN PELVIS FINDINGS Hepatobiliary: Stable and negative, surgically absent gallbladder with prominent CBD. Pancreas: Stable and negative. Spleen: Stable and negative. Adrenals/Urinary Tract: Stable mild adrenal gland thickening such as due to adrenal hyperplasia. Bilateral noncontrast kidneys appear stable. Probable bilateral benign renal parapelvic cysts. No perinephric stranding. No nephrolithiasis. Both ureters are negative. Pelvic floor laxity with prolapse of the urinary bladder, vagina, and rectum. The appearance is mildly progressed since 2017. Stomach/Bowel: Decompressed rectum. Mild to moderate sigmoid diverticulosis without active inflammation. Decompressed left colon. Redundant splenic flexure with mild to moderate retained stool. Similar retained stool in the mildly redundant transverse colon and hepatic flexure. Similar retained stool in the right colon. Diminutive or absent appendix. No large bowel inflammation. Negative terminal ileum. No dilated small bowel. Decompressed stomach and duodenum. Small 2nd portion duodenal diverticulum is unchanged. No mesentery inflammatory stranding identified. No abdominal free air or free fluid.  Vascular/Lymphatic: Aortoiliac calcified atherosclerosis. Mild ectasia of the proximal abdominal aorta is stable. There is a chronic fusiform there is chronic fusiform aneurysmal enlargement of the celiac artery trunk, 14-15 millimeters diameter. This is stable. Vascular patency is not evaluated in the absence of IV contrast. No lymphadenopathy. Reproductive: Surgically absent. Other: Chronic postoperative changes along the anterior and right side pelvic floor with several chronic surgical clips. Chronic pelvic floor laxity. No pelvic free fluid. Musculoskeletal: Chronic lumbar disc and endplate degeneration. Stable visualized osseous structures. Benign vertebral hemangioma in the right L4 body suspected and stable. Chronic pubic rami fractures are stable. IMPRESSION: 1. No acute or inflammatory process identified in the noncontrast chest, abdomen, or pelvis. 2. Chronic Aortic Atherosclerosis (ICD10-I70.0) and ectasia of the thoracic and proximal abdominal aorta. 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 3. Chronic lower lobe Bronchiectasis and scarring. 4. Chronic pelvic floor laxity with prolapse of the urinary bladder, vagina, and rectum appears mildly progressed since 2017. 5. Mild sigmoid diverticulosis. Electronically Signed   By: Odessa Fleming M.D.   On: 12/21/2017 09:22   Dg Chest 2 View  Result Date: 12/21/2017 CLINICAL DATA:  Chest pain and cough for several hours EXAM: CHEST - 2 VIEW COMPARISON:  12/21/2017 FINDINGS: Cardiac shadow is mildly enlarged but accentuated by the portable technique. Aortic atherosclerotic changes are noted. Minimal left basilar atelectasis is seen. No other focal abnormality is noted. IMPRESSION: Minimal left basilar atelectasis. Electronically Signed   By: Alcide Clever M.D.   On: 12/21/2017 09:23   Ct Chest Wo Contrast  Result Date: 12/21/2017 CLINICAL DATA:  84 year old female with acute midsternal chest pain at 0400 hours. Cough and  congestion. Abdominal distension and pain. EXAM: CT CHEST, ABDOMEN AND PELVIS WITHOUT CONTRAST TECHNIQUE: Multidetector CT imaging of the chest, abdomen and pelvis was performed following the standard protocol without IV contrast. COMPARISON:  CT chest abdomen and pelvis 04/09/2016. FINDINGS: CT CHEST FINDINGS Cardiovascular: Chronic ectatic thoracic aorta. The descending thoracic aorta measures up to 33 mm diameter, stable since 2017. Chronic aortic and coronary artery calcified atherosclerosis.  Mild cardiomegaly is stable. Vascular patency is not evaluated in the absence of IV contrast. No pericardial effusion. Mediastinum/Nodes: Negative.  No mediastinal lymphadenopathy. Lungs/Pleura: Major airways are patent. Chronic right greater than left basilar segmental lower lobe bronchiectasis (series 4, image 113). Associated chronic peribronchial thickening. Associated regional atelectasis or scarring is also stable. No pleural effusion or acute pulmonary opacity. Musculoskeletal: Stable visualized osseous structures. Chronic benign vertebral hemangiomas in the T5 and T7 levels. CT ABDOMEN PELVIS FINDINGS Hepatobiliary: Stable and negative, surgically absent gallbladder with prominent CBD. Pancreas: Stable and negative. Spleen: Stable and negative. Adrenals/Urinary Tract: Stable mild adrenal gland thickening such as due to adrenal hyperplasia. Bilateral noncontrast kidneys appear stable. Probable bilateral benign renal parapelvic cysts. No perinephric stranding. No nephrolithiasis. Both ureters are negative. Pelvic floor laxity with prolapse of the urinary bladder, vagina, and rectum. The appearance is mildly progressed since 2017. Stomach/Bowel: Decompressed rectum. Mild to moderate sigmoid diverticulosis without active inflammation. Decompressed left colon. Redundant splenic flexure with mild to moderate retained stool. Similar retained stool in the mildly redundant transverse colon and hepatic flexure. Similar  retained stool in the right colon. Diminutive or absent appendix. No large bowel inflammation. Negative terminal ileum. No dilated small bowel. Decompressed stomach and duodenum. Small 2nd portion duodenal diverticulum is unchanged. No mesentery inflammatory stranding identified. No abdominal free air or free fluid. Vascular/Lymphatic: Aortoiliac calcified atherosclerosis. Mild ectasia of the proximal abdominal aorta is stable. There is a chronic fusiform there is chronic fusiform aneurysmal enlargement of the celiac artery trunk, 14-15 millimeters diameter. This is stable. Vascular patency is not evaluated in the absence of IV contrast. No lymphadenopathy. Reproductive: Surgically absent. Other: Chronic postoperative changes along the anterior and right side pelvic floor with several chronic surgical clips. Chronic pelvic floor laxity. No pelvic free fluid. Musculoskeletal: Chronic lumbar disc and endplate degeneration. Stable visualized osseous structures. Benign vertebral hemangioma in the right L4 body suspected and stable. Chronic pubic rami fractures are stable. IMPRESSION: 1. No acute or inflammatory process identified in the noncontrast chest, abdomen, or pelvis. 2. Chronic Aortic Atherosclerosis (ICD10-I70.0) and ectasia of the thoracic and proximal abdominal aorta. 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 3. Chronic lower lobe Bronchiectasis and scarring. 4. Chronic pelvic floor laxity with prolapse of the urinary bladder, vagina, and rectum appears mildly progressed since 2017. 5. Mild sigmoid diverticulosis. Electronically Signed   By: Odessa FlemingH  Hall M.D.   On: 12/21/2017 09:22   [place] Venous Img Lower Unilateral Right  Result Date: 12/21/2017 CLINICAL DATA:  Leg swelling x1 month, chest pain x4 hours EXAM: RIGHT LOWER EXTREMITY VENOUS DOPPLER ULTRASOUND TECHNIQUE: Gray-scale sonography with compression, as well as color and duplex ultrasound, were performed to  evaluate the deep venous system from the level of the common femoral vein through the popliteal and proximal calf veins. COMPARISON:  06/21/2008 FINDINGS: Normal compressibility of the common femoral, superficial femoral, and popliteal veins, as well as the proximal calf veins. No filling defects to suggest DVT on grayscale or color Doppler imaging. Doppler waveforms show normal direction of venous flow, normal respiratory phasicity and response to augmentation. Survey views of the contralateral common femoral vein are unremarkable. IMPRESSION: No evidence of RIGHT lower extremity deep vein thrombosis. Electronically Signed   By: Corlis Leak  Hassell M.D.   On: 12/21/2017 09:35    EKG:   Orders placed or performed during the hospital encounter of 12/21/17  . EKG 12-Lead  . EKG 12-Lead  . ED EKG  within 10 minutes  . ED EKG within 10 minutes  . EKG 12-Lead  . EKG 12-Lead      Management plans discussed with the patient, family and they are in agreement.  CODE STATUS:     Code Status Orders  (From admission, onward)        Start     Ordered   12/21/17 1213  Full code  Continuous     12/21/17 1212    Code Status History    This patient has a current code status but no historical code status.      TOTAL TIME TAKING CARE OF THIS PATIENT: 35 minutes.    Evelena Asa Julien Berryman M.D on 12/22/2017 at 10:39 AM  Between 7am to 6pm - Pager - [phone]  After 6pm go to [email or web address] - password [email or web address]  [facility]  Office  [phone]  CC: Primary care physician; Hillery Aldo, MD   Note: This dictation was prepared with Dragon dictation along with smaller phrase technology. Any transcriptional errors that result from this process are unintentional.

## 2017-12-22 NOTE — Progress Notes (Signed)
Patient refuses bed alarm. Educated on safety. Agrees to call before getting up. 

## 2018-05-22 ENCOUNTER — Other Ambulatory Visit: Payer: Self-pay

## 2018-05-22 ENCOUNTER — Emergency Department: Payer: Medicare HMO

## 2018-05-22 ENCOUNTER — Encounter: Payer: Self-pay | Admitting: *Deleted

## 2018-05-22 ENCOUNTER — Observation Stay
Admission: EM | Admit: 2018-05-22 | Discharge: 2018-05-24 | Disposition: A | Discharge status: Home / Self Care | Payer: Medicare HMO | Attending: Internal Medicine | Admitting: Internal Medicine

## 2018-05-22 ENCOUNTER — Inpatient Hospital Stay: Payer: Medicare HMO

## 2018-05-22 DIAGNOSIS — Z79899 Other long term (current) drug therapy: Secondary | ICD-10-CM | POA: Insufficient documentation

## 2018-05-22 DIAGNOSIS — Z882 Allergy status to sulfonamides status: Secondary | ICD-10-CM | POA: Diagnosis not present

## 2018-05-22 DIAGNOSIS — Z885 Allergy status to narcotic agent status: Secondary | ICD-10-CM | POA: Diagnosis not present

## 2018-05-22 DIAGNOSIS — R0781 Pleurodynia: Secondary | ICD-10-CM

## 2018-05-22 DIAGNOSIS — I10 Essential (primary) hypertension: Secondary | ICD-10-CM | POA: Insufficient documentation

## 2018-05-22 DIAGNOSIS — M1990 Unspecified osteoarthritis, unspecified site: Secondary | ICD-10-CM | POA: Diagnosis not present

## 2018-05-22 DIAGNOSIS — Z881 Allergy status to other antibiotic agents status: Secondary | ICD-10-CM | POA: Diagnosis not present

## 2018-05-22 DIAGNOSIS — Z79890 Hormone replacement therapy (postmenopausal): Secondary | ICD-10-CM | POA: Diagnosis not present

## 2018-05-22 DIAGNOSIS — Z886 Allergy status to analgesic agent status: Secondary | ICD-10-CM | POA: Diagnosis not present

## 2018-05-22 DIAGNOSIS — R7989 Other specified abnormal findings of blood chemistry: Secondary | ICD-10-CM

## 2018-05-22 DIAGNOSIS — Z91041 Radiographic dye allergy status: Secondary | ICD-10-CM | POA: Insufficient documentation

## 2018-05-22 DIAGNOSIS — Z888 Allergy status to other drugs, medicaments and biological substances status: Secondary | ICD-10-CM | POA: Diagnosis not present

## 2018-05-22 DIAGNOSIS — Z86711 Personal history of pulmonary embolism: Secondary | ICD-10-CM | POA: Diagnosis not present

## 2018-05-22 DIAGNOSIS — J441 Chronic obstructive pulmonary disease with (acute) exacerbation: Secondary | ICD-10-CM | POA: Diagnosis not present

## 2018-05-22 LAB — CBC
HEMATOCRIT: 36.7 % (ref 35.0–47.0)
HEMOGLOBIN: 12.7 g/dL (ref 12.0–16.0)
MCH: 28.5 pg (ref 26.0–34.0)
MCHC: 34.6 g/dL (ref 32.0–36.0)
MCV: 82.3 fL (ref 80.0–100.0)
Platelets: 213 10*3/uL (ref 150–440)
RBC: 4.47 MIL/uL (ref 3.80–5.20)
RDW: 15.3 % — ABNORMAL HIGH (ref 11.5–14.5)
WBC: 7.5 10*3/uL (ref 3.6–11.0)

## 2018-05-22 LAB — TROPONIN I: Troponin I: <0.03 ng/mL (ref <0.03)

## 2018-05-22 LAB — BASIC METABOLIC PANEL
ANION GAP: 8 (ref 5–15)
BUN: 14 mg/dL (ref 8–23)
CHLORIDE: 107 mmol/L (ref 98–111)
CO2: 27 mmol/L (ref 22–32)
Calcium: 9.4 mg/dL (ref 8.9–10.3)
Creatinine, Ser: 0.97 mg/dL (ref 0.44–1.00)
GFR calc Af Amer: >60 mL/min (ref >60)
GFR, EST NON AFRICAN AMERICAN: 53 mL/min — AB (ref >60)
GLUCOSE: 118 mg/dL — AB (ref 70–99)
POTASSIUM: 3.9 mmol/L (ref 3.5–5.1)
Sodium: 142 mmol/L (ref 135–145)

## 2018-05-22 LAB — FIBRIN DERIVATIVES D-DIMER (ARMC ONLY): Fibrin derivatives D-dimer (ARMC): 804.26 ng/mL (FEU) — ABNORMAL HIGH (ref 0.00–499.00)

## 2018-05-22 LAB — MAGNESIUM: Magnesium: 2.1 mg/dL (ref 1.7–2.4)

## 2018-05-22 MED ORDER — DOXYCYCLINE HYCLATE 100 MG PO TABS
100.0000 mg | ORAL_TABLET | Freq: Once | ORAL | Status: AC
  Administered 2018-05-22: 100 mg via ORAL
  Filled 2018-05-22: qty 1

## 2018-05-22 MED ORDER — BISACODYL 5 MG PO TBEC: 5.0000 mg | DELAYED_RELEASE_TABLET | Freq: Every day | ORAL | Status: DC | PRN

## 2018-05-22 MED ORDER — SODIUM CHLORIDE 0.9% FLUSH
3.0000 mL | Freq: Two times a day (BID) | INTRAVENOUS | Status: DC
  Administered 2018-05-23 – 2018-05-24 (×4): 3 mL via INTRAVENOUS

## 2018-05-22 MED ORDER — METHYLPREDNISOLONE SODIUM SUCC 125 MG IJ SOLR
80.0000 mg | INTRAMUSCULAR | Status: AC
  Administered 2018-05-22: 80 mg via INTRAVENOUS
  Filled 2018-05-22: qty 2

## 2018-05-22 MED ORDER — ALBUTEROL SULFATE (2.5 MG/3ML) 0.083% IN NEBU
2.5000 mg | INHALATION_SOLUTION | Freq: Once | RESPIRATORY_TRACT | Status: AC
  Administered 2018-05-22: 2.5 mg via RESPIRATORY_TRACT
  Filled 2018-05-22: qty 3

## 2018-05-22 MED ORDER — AMLODIPINE BESYLATE 5 MG PO TABS
2.5000 mg | ORAL_TABLET | Freq: Every day | ORAL | Status: DC
  Administered 2018-05-23 – 2018-05-24 (×2): 2.5 mg via ORAL
  Filled 2018-05-22 (×2): qty 1

## 2018-05-22 MED ORDER — METHYLPREDNISOLONE SODIUM SUCC 125 MG IJ SOLR
60.0000 mg | Freq: Two times a day (BID) | INTRAMUSCULAR | Status: DC
  Administered 2018-05-23 (×2): 60 mg via INTRAVENOUS
  Filled 2018-05-22 (×3): qty 2

## 2018-05-22 MED ORDER — SENNOSIDES-DOCUSATE SODIUM 8.6-50 MG PO TABS: 1.0000 | ORAL_TABLET | Freq: Every evening | ORAL | Status: DC | PRN

## 2018-05-22 MED ORDER — SODIUM CHLORIDE 0.9% FLUSH: 3.0000 mL | INTRAVENOUS | Status: DC | PRN

## 2018-05-22 MED ORDER — ONDANSETRON HCL 4 MG/2ML IJ SOLN: 4.0000 mg | Freq: Four times a day (QID) | INTRAMUSCULAR | Status: DC | PRN

## 2018-05-22 MED ORDER — TRAMADOL HCL 50 MG PO TABS: 50.0000 mg | ORAL_TABLET | Freq: Four times a day (QID) | ORAL | Status: DC | PRN

## 2018-05-22 MED ORDER — ONDANSETRON HCL 4 MG PO TABS: 4.0000 mg | ORAL_TABLET | Freq: Four times a day (QID) | ORAL | Status: DC | PRN

## 2018-05-22 MED ORDER — ACETAMINOPHEN 325 MG PO TABS
650.0000 mg | ORAL_TABLET | Freq: Four times a day (QID) | ORAL | Status: DC | PRN
  Administered 2018-05-23 – 2018-05-24 (×3): 650 mg via ORAL
  Filled 2018-05-22 (×3): qty 2

## 2018-05-22 MED ORDER — ENOXAPARIN SODIUM 40 MG/0.4ML ~~LOC~~ SOLN
40.0000 mg | SUBCUTANEOUS | Status: DC
  Administered 2018-05-23 (×2): 40 mg via SUBCUTANEOUS
  Filled 2018-05-22 (×2): qty 0.4

## 2018-05-22 MED ORDER — ACETAMINOPHEN 650 MG RE SUPP: 650.0000 mg | Freq: Four times a day (QID) | RECTAL | Status: DC | PRN

## 2018-05-22 MED ORDER — POLYETHYLENE GLYCOL 3350 17 G PO PACK
17.0000 g | PACK | ORAL | Status: DC
  Filled 2018-05-22: qty 1

## 2018-05-22 MED ORDER — ALBUTEROL SULFATE (2.5 MG/3ML) 0.083% IN NEBU
2.5000 mg | INHALATION_SOLUTION | Freq: Four times a day (QID) | RESPIRATORY_TRACT | Status: DC
  Administered 2018-05-23 – 2018-05-24 (×4): 2.5 mg via RESPIRATORY_TRACT
  Filled 2018-05-22 (×6): qty 3

## 2018-05-22 MED ORDER — LEVOTHYROXINE SODIUM 50 MCG PO TABS
75.0000 ug | ORAL_TABLET | Freq: Every day | ORAL | Status: DC
  Administered 2018-05-23 – 2018-05-24 (×2): 75 ug via ORAL
  Filled 2018-05-22 (×2): qty 1

## 2018-05-22 MED ORDER — DOXYCYCLINE HYCLATE 100 MG PO TABS
100.0000 mg | ORAL_TABLET | Freq: Two times a day (BID) | ORAL | Status: DC
  Filled 2018-05-22 (×2): qty 1

## 2018-05-22 MED ORDER — BENZONATATE 100 MG PO CAPS: 100.0000 mg | ORAL_CAPSULE | Freq: Three times a day (TID) | ORAL | Status: DC | PRN

## 2018-05-22 MED ORDER — SODIUM CHLORIDE 0.9 % IV SOLN: 250.0000 mL | INTRAVENOUS | Status: DC | PRN

## 2018-05-22 MED ORDER — PANTOPRAZOLE SODIUM 40 MG PO TBEC
40.0000 mg | DELAYED_RELEASE_TABLET | Freq: Every day | ORAL | Status: DC
  Administered 2018-05-23 – 2018-05-24 (×2): 40 mg via ORAL
  Filled 2018-05-22 (×2): qty 1

## 2018-05-22 MED ORDER — ALBUTEROL SULFATE (2.5 MG/3ML) 0.083% IN NEBU
2.5000 mg | INHALATION_SOLUTION | RESPIRATORY_TRACT | Status: DC | PRN
  Administered 2018-05-22: 23:00:00 2.5 mg via RESPIRATORY_TRACT
  Filled 2018-05-22: qty 3

## 2018-05-22 MED ORDER — KETOROLAC TROMETHAMINE 15 MG/ML IJ SOLN
15.0000 mg | Freq: Four times a day (QID) | INTRAMUSCULAR | Status: DC | PRN
  Filled 2018-05-22: qty 1

## 2018-05-22 MED ORDER — ALUM & MAG HYDROXIDE-SIMETH 200-200-20 MG/5ML PO SUSP
30.0000 mL | Freq: Four times a day (QID) | ORAL | Status: DC | PRN
  Administered 2018-05-23 (×2): 30 mL via ORAL
  Filled 2018-05-22 (×2): qty 30

## 2018-05-22 NOTE — ED Provider Notes (Signed)
[facility] Emergency Department Provider Note   ____________________________________________   First MD Initiated Contact with Patient 05/22/18 1753     (approximate)  I have reviewed the triage vital signs and the nursing notes.   HISTORY  Chief Complaint Cough and Chest Pain    HPI Brooke Perez is a 84 y.o. female reports for about 2 to 3 days now she is been experiencing shortness of breath with wheezing, using her albuterol inhaler without relief and some wheezing now with a cough and chills.  Also having a somewhat sharp discomfort over the right chest worsened with deep inspiration and coughing.  Feels like she is having "COPD".  Reports that she does get sick her blood pressures are always elevated, but checks them regularly and they are usually not elevated at home  No leg swelling.  Does have a history of previous pulmonary embolism over 10 years ago and is no longer on blood thinners.  Reports she is not certain of the cause.     Past Medical History:  Diagnosis Date  . Allergy 2012   ? benzimidazole, IV dye  . Arthritis    since 1997  . Asthma    Per patient.  . Blood in stool   . Breast screening, unspecified   . COPD (chronic obstructive pulmonary disease) (HCC)   . H/O blood clots 2004  . History of cholecystitis 1986  . Hypertension 1997   since 1997  . PE (pulmonary thromboembolism) (HCC)    2004  . Prolapse of intestine   . Special screening for malignant neoplasms, colon     Patient Active Problem List   Diagnosis Date Noted  . COPD exacerbation (HCC) 05/22/2018  . Chest pain 12/21/2017  . Dyspnea 10/29/2013  . H/O blood clots     Past Surgical History:  Procedure Laterality Date  . ABDOMINAL HYSTERECTOMY  1968  . broken hand Right 1986  . CHOLECYSTECTOMY  1999   h/o cholecystitis in 1986  . COLON SURGERY  2000  . COLONOSCOPY W/ POLYPECTOMY  12/25/2010   one 5mm semisessile polyp was found in the  descending colon  . goiter removal  1965  . TONSILLECTOMY  1954  . TUBAL LIGATION  1968    Prior to Admission medications   Medication Sig Start Date End Date Taking? Authorizing Provider  albuterol (PROVENTIL) (2.5 MG/3ML) 0.083% nebulizer solution Take 2.5 mg by nebulization every 6 (six) hours as needed for wheezing or shortness of breath.    [provider]  albuterol (VENTOLIN HFA) 108 (90 BASE) MCG/ACT inhaler Inhale 2 puffs into the lungs every 6 (six) hours as needed for wheezing or shortness of breath.    [provider]  amLODipine (NORVASC) 5 MG tablet Take 2.5 mg by mouth daily.     [provider]  levothyroxine (SYNTHROID, LEVOTHROID) 75 MCG tablet Take 75 mcg by mouth daily before breakfast.    [provider]  pantoprazole (PROTONIX) 40 MG tablet Take 1 tablet (40 mg total) by mouth daily. 12/22/17 12/22/18  Salary, Evelena AsaMontell D, MD  Polyethylene Glycol 3350 (MIRALAX PO) Take 1 Package by mouth 3 (three) times a week.     [provider]    Allergies Alprazolam; Aspirin; Atenolol; Atrovent [ipratropium]; Ciprofloxacin; Clonidine derivatives; Codeine; Contrast media [iodinated diagnostic agents]; Coumadin [warfarin sodium]; Darvon [propoxyphene hcl]; Demerol [meperidine]; Flovent [fluticasone]; Guiatuss [guaifenesin]; Iohexol; Keflex [cephalexin]; Morphine and related; Nitroglycerin; Parafon forte dsc [chlorzoxazone]; Plavix [clopidogrel bisulfate]; Prednisone (pak) [prednisone];  Pyrimethamine; Sulfa antibiotics; and Zithromax [azithromycin]  Family History  Problem Relation Age of Onset  . Emphysema Sister        smoked  . Emphysema Sister        smoked  . Asthma Sister   . Asthma Brother   . Heart disease Mother   . Heart disease Maternal Uncle   . Heart disease Brother     Social History Social History   Tobacco Use  . Smoking status: Never Smoker  . Smokeless tobacco: Never Used  Substance Use Topics  . Alcohol use: No      Comment: never alcoholic  . Drug use: No    Review of Systems Constitutional: Chills and fatigue  Eyes: No visual changes. ENT: No sore throat. Cardiovascular: HPI, some discomfort right chest off-and-on worse with coughing Respiratory: Slight shortness of breath and wheezing.  Reports "COPD" Gastrointestinal: No abdominal pain.  No nausea, no vomiting.  No diarrhea.  No constipation. Genitourinary: Negative for dysuria. Musculoskeletal: Negative for back pain. Skin: Negative for rash. Neurological: Negative for headaches, focal weakness or numbness.    ____________________________________________   PHYSICAL EXAM:  VITAL SIGNS: ED Triage Vitals  Enc Vitals Group     BP 05/22/18 1648 (!) 190/97     Pulse Rate 05/22/18 1648 80     Resp 05/22/18 1648 20     Temp 05/22/18 1648 98.2 F (36.8 C)     Temp Source 05/22/18 1648 Oral     SpO2 05/22/18 1648 96 %     Weight 05/22/18 1646 140 lb (63.5 kg)     Height 05/22/18 1646 5\' 6"  (1.676 m)     Head Circumference --      Peak Flow --      Pain Score 05/22/18 1645 9     Pain Loc --      Pain Edu? --      Excl. in GC? --     Constitutional: Alert and oriented. Well appearing and in no acute distress though does appear slightly short of breath with increased end expiratory and slight pursed lips. Eyes: Conjunctivae are normal. Head: Atraumatic. Nose: No congestion/rhinnorhea. Mouth/Throat: Mucous membranes are moist. Neck: No stridor.   Cardiovascular: Normal rate, regular rhythm. Grossly normal heart sounds.  Good peripheral circulation. Respiratory: Slight use of accessory muscles.  Speaking in phrases.  On exam mild end expiratory wheezing with increased expiratory phase throughout.  No focal crackles or rales. Gastrointestinal: Soft and nontender. No distention. Musculoskeletal: No lower extremity tenderness nor edema.  No venous cords or congestion.  No thigh tenderness. Neurologic:  Normal speech and language. No  gross focal neurologic deficits are appreciated.  Skin:  Skin is warm, dry and intact. No rash noted. Psychiatric: Mood and affect are normal. Speech and behavior are normal.  ____________________________________________   LABS (all labs ordered are listed, but only abnormal results are displayed)  Labs Reviewed  BASIC METABOLIC PANEL - Abnormal; Notable for the following components:      Result Value   Glucose, Bld 118 (*)    GFR calc non Af Amer 53 (*)    All other components within normal limits  CBC - Abnormal; Notable for the following components:   RDW 15.3 (*)    All other components within normal limits  FIBRIN DERIVATIVES D-DIMER ([facility] ONLY) - Abnormal; Notable for the following components:   Fibrin derivatives D-dimer ([facility]) 804.26 (*)    All other components within normal limits  CULTURE, BLOOD (  ROUTINE X 2)  CULTURE, BLOOD (ROUTINE X 2)  TROPONIN I   ____________________________________________  EKG  Reviewed enterotomy at 1650 Normal sinus rhythm Incomplete right bundle branch block Nonspecific ST abnormality.  Modest prolongation of the QT interval. ____________________________________________  RADIOLOGY  Dg Chest 2 View  Result Date: 05/22/2018 CLINICAL DATA:  Cough, chest pain EXAM: CHEST - 2 VIEW COMPARISON:  Chest radiograph 12/21/2017 FINDINGS: Stable cardiac contours. Tortuosity of the thoracic aorta. Heterogeneous opacities left lung base. No pleural effusion or pneumothorax. Regional skeleton is unremarkable. IMPRESSION: Heterogeneous opacities left lung base may represent atelectasis or infection. Electronically Signed   By: Annia Beltrew  Davis M.D.   On: 05/22/2018 17:25    Chest x-ray reviewed negative for acute ____________________________________________   PROCEDURES  Procedure(s) performed: None  Procedures  Critical Care performed: No  ____________________________________________   INITIAL IMPRESSION / ASSESSMENT AND PLAN / ED  COURSE  Pertinent labs & imaging results that were available during my care of the patient were reviewed by me and considered in my medical decision making (see chart for details).  Patient returns for evaluation of shortness of breath, also some slight discomfort somewhat pleuritic over the right chest.  Overall my clinical examination history seems just likely a COPD exacerbation, and review of her x-ray imaging afebrile status and white count seems suggest against a high likelihood of left lower lobe pneumonia though we will initiate her on antibiotic including doxycycline.  She has multiple drug allergies that I have reviewed with her including a severe allergy to IV contrast which she reports  She does have a remote history of DVT, denies leg pain and has no objective clinical exam findings suggest DVT so I will send a d-dimer to help and exclusion.  ----------------------------------------- 7:43 PM on 05/22/2018 -----------------------------------------  Patient reports her breathing is improved, breathing much more comfortably with normal respiratory rate and oxygen saturation on reexam.  Lungs are clear.  Feeling better, but does have an elevated d-dimer with some slight lingering pleuritic discomfort in the chest which I suspect is more likely due to her cough than to an event such as pulmonary embolism.  Will send for venous ultrasound and if DVT positive would highly consider the possibility of PE, but until then discussed with the hospitalist will admit for further evaluation and likely need for VQ scan to exclude PE given the positive d-dimer and her known allergies.  Patient and her husband agreeable with plan.  Overall much improved      ____________________________________________   FINAL CLINICAL IMPRESSION(S) / ED DIAGNOSES  Final diagnoses:  COPD with acute exacerbation (HCC)  Pleuritic chest pain  Positive D dimer      NEW MEDICATIONS STARTED DURING THIS  VISIT:  New Prescriptions   No medications on file     Note:  This document was prepared using Dragon voice recognition software and may include unintentional dictation errors.     Sharyn CreamerQuale, Mark, MD 05/22/18 1944

## 2018-05-22 NOTE — ED Notes (Signed)
Patient refuses offer of pain medication, states her headache has gone away.  Patient to ultrasound at this time and will then go to floor room.  Floor RN notified.

## 2018-05-22 NOTE — ED Notes (Signed)
Spoke with ultrasound tech, okay to take patient to inpatient bed prior to having ultrasound.

## 2018-05-22 NOTE — ED Triage Notes (Signed)
Pt to triage via wheelchair.  Pt has a cough and chest pain.  Sx for several months.  Nonsmoker. Pt reports sob. Hx bronchitis,copd.  Pt alert

## 2018-05-22 NOTE — Progress Notes (Signed)
Advanced Care Plan.  Purpose of Encounter: CODE STATUS. Parties in Attendance: The patient, her husband and me. Patient's Decisional Capacity: Yes. Medical Story: Brooke MannanBarbara Robins  is a 84 y.o. female with a known history of COPD, hypertension, PE, arthritis and prolapse of intestine.  Patient is being admitted to hospital for COPD exacerbation, possible pneumonia and rule out PE.  I discussed the patient current condition, prognosis and CODE STATUS.  The patient stated that she wants to be resuscitated and intubated, she wants to live longer.  Plan:  Code Status: Full code Time spent discussing advance care planning: 16 to 17 minutes.

## 2018-05-22 NOTE — H&P (Signed)
[facility] [place] [facility]   PATIENT NAME: Brooke MannanBarbara Perez    MR#:  [id number]  DATE OF BIRTH:  Feb 26, 1933  DATE OF ADMISSION:  05/22/2018  PRIMARY CARE PHYSICIAN: Hillery AldoPatel, Sarah, MD   REQUESTING/REFERRING PHYSICIAN: Dr. Fanny BienQuale.  CHIEF COMPLAINT:   Chief Complaint  Patient presents with  . Cough  . Chest Pain   Cough, shortness of breath and chest pain for 3 days. HISTORY OF PRESENT ILLNESS:  Brooke Perez  is a 84 y.o. female with a known history of COPD, hypertension, PE, arthritis and prolapse of intestine.  The patient presents to the ED with above chief complaints.  She has had shortness of breath, wheezing and cough for 2 to 3 days.  She complains of chest pain while coughing and taking deep breaths.  She also complains of fever, chills, dizziness.  Chest x-ray suspect atelectasis or infection.  She is treated with IV steroid and nebulizer in the ED.  She still has shortness of breath.  Her d-dimer is elevated but she has allergy to contrast, unable to get a CT angiogram to rule out PE.  PAST MEDICAL HISTORY:   Past Medical History:  Diagnosis Date  . Allergy 2012   ? benzimidazole, IV dye  . Arthritis    since 1997  . Asthma    Per patient.  . Blood in stool   . Breast screening, unspecified   . COPD (chronic obstructive pulmonary disease) (HCC)   . H/O blood clots 2004  . History of cholecystitis 1986  . Hypertension 1997   since 1997  . PE (pulmonary thromboembolism) (HCC)    2004  . Prolapse of intestine   . Special screening for malignant neoplasms, colon     PAST SURGICAL HISTORY:   Past Surgical History:  Procedure Laterality Date  . ABDOMINAL HYSTERECTOMY  1968  . broken hand Right 1986  . CHOLECYSTECTOMY  1999   h/o cholecystitis in 1986  . COLON SURGERY  2000  . COLONOSCOPY W/ POLYPECTOMY  12/25/2010   one 5mm semisessile polyp was found in the descending colon  . goiter removal  1965  . TONSILLECTOMY  1954  . TUBAL  LIGATION  1968    SOCIAL HISTORY:   Social History   Tobacco Use  . Smoking status: Never Smoker  . Smokeless tobacco: Never Used  Substance Use Topics  . Alcohol use: No    Comment: never alcoholic    FAMILY HISTORY:   Family History  Problem Relation Age of Onset  . Emphysema Sister        smoked  . Emphysema Sister        smoked  . Asthma Sister   . Asthma Brother   . Heart disease Mother   . Heart disease Maternal Uncle   . Heart disease Brother     DRUG ALLERGIES:   Allergies  Allergen Reactions  . Alprazolam     unknown  . Aspirin     unknown  . Atenolol     unknown  . Atrovent [Ipratropium]     unknown  . Ciprofloxacin     unknown  . Clonidine Derivatives     unknown  . Codeine     unknown  . Contrast Media [Iodinated Diagnostic Agents]     unknown  . Coumadin [Warfarin Sodium]     unknown  . Darvon [Propoxyphene Hcl]     unknown  . Demerol [Meperidine]     unknown  .  Flovent [Fluticasone]     unknown  . Guiatuss [Guaifenesin]     unknown  . Iohexol     unknown  . Keflex [Cephalexin]     unknown  . Morphine And Related     unknown  . Nitroglycerin     unknown  . Parafon Forte Dsc [Chlorzoxazone]     unknown  . Plavix [Clopidogrel Bisulfate]     unknown  . Prednisone (Pak) [Prednisone] Other (See Comments)    coughing  . Pyrimethamine     unknown  . Sulfa Antibiotics     unknown  . Zithromax [Azithromycin]     unknown    REVIEW OF SYSTEMS:   Review of Systems  Constitutional: Positive for chills, fever and malaise/fatigue.  HENT: Negative for sore throat.   Eyes: Negative for blurred vision and double vision.  Respiratory: Positive for cough, sputum production, shortness of breath and wheezing. Negative for hemoptysis and stridor.   Cardiovascular: Negative for chest pain, palpitations, orthopnea and leg swelling.  Gastrointestinal: Negative for abdominal pain, blood in stool, diarrhea, melena, nausea and vomiting.    Genitourinary: Negative for dysuria, flank pain and hematuria.  Musculoskeletal: Negative for back pain and joint pain.  Skin: Negative for rash.  Neurological: Negative for dizziness, sensory change, focal weakness, seizures, loss of consciousness, weakness and headaches.  Endo/Heme/Allergies: Negative for polydipsia.  Psychiatric/Behavioral: Negative for depression. The patient is not nervous/anxious.     MEDICATIONS AT HOME:   Prior to Admission medications   Medication Sig Start Date End Date Taking? Authorizing Provider  albuterol (PROVENTIL) (2.5 MG/3ML) 0.083% nebulizer solution Take 2.5 mg by nebulization every 6 (six) hours as needed for wheezing or shortness of breath.    [provider]  albuterol (VENTOLIN HFA) 108 (90 BASE) MCG/ACT inhaler Inhale 2 puffs into the lungs every 6 (six) hours as needed for wheezing or shortness of breath.    [provider]  amLODipine (NORVASC) 5 MG tablet Take 2.5 mg by mouth daily.     [provider]  levothyroxine (SYNTHROID, LEVOTHROID) 75 MCG tablet Take 75 mcg by mouth daily before breakfast.    [provider]  pantoprazole (PROTONIX) 40 MG tablet Take 1 tablet (40 mg total) by mouth daily. 12/22/17 12/22/18  Salary, Evelena AsaMontell D, MD  Polyethylene Glycol 3350 (MIRALAX PO) Take 1 Package by mouth 3 (three) times a week.     [provider]      VITAL SIGNS:  Blood pressure (!) 146/110, pulse 93, temperature 98.2 F (36.8 C), temperature source Oral, resp. rate 15, height 5\' 6"  (1.676 m), weight 63.5 kg, SpO2 98 %.  PHYSICAL EXAMINATION:  Physical Exam  GENERAL:  84 y.o.-year-old patient lying in the bed with no acute distress.  EYES: Pupils equal, round, reactive to light and accommodation. No scleral icterus. Extraocular muscles intact.  HEENT: Head atraumatic, normocephalic. Oropharynx and nasopharynx clear.  NECK:  Supple, no jugular venous distention. No thyroid enlargement, no tenderness.   LUNGS: Bilateral wheezing and rhonchi. No use of accessory muscles of respiration.  CARDIOVASCULAR: S1, S2 normal. No murmurs, rubs, or gallops.  ABDOMEN: Soft, abdominal tenderness, nondistended. Bowel sounds present. No organomegaly or mass.  EXTREMITIES: No pedal edema, cyanosis, or clubbing.  NEUROLOGIC: Cranial nerves II through XII are intact. Muscle strength 5/5 in all extremities. Sensation intact. Gait not checked.  PSYCHIATRIC: The patient is alert and oriented x 3.  SKIN: No obvious rash, lesion, or ulcer.   LABORATORY PANEL:  CBC Recent Labs  Lab 05/22/18 1700  WBC 7.5  HGB 12.7  HCT 36.7  PLT 213   ------------------------------------------------------------------------------------------------------------------  Chemistries  Recent Labs  Lab 05/22/18 1700  NA 142  K 3.9  CL 107  CO2 27  GLUCOSE 118*  BUN 14  CREATININE 0.97  CALCIUM 9.4   ------------------------------------------------------------------------------------------------------------------  Cardiac Enzymes Recent Labs  Lab 05/22/18 1700  TROPONINI <0.03   ------------------------------------------------------------------------------------------------------------------  RADIOLOGY:  Dg Chest 2 View  Result Date: 05/22/2018 CLINICAL DATA:  Cough, chest pain EXAM: CHEST - 2 VIEW COMPARISON:  Chest radiograph 12/21/2017 FINDINGS: Stable cardiac contours. Tortuosity of the thoracic aorta. Heterogeneous opacities left lung base. No pleural effusion or pneumothorax. Regional skeleton is unremarkable. IMPRESSION: Heterogeneous opacities left lung base may represent atelectasis or infection. Electronically Signed   By: Annia Belt M.D.   On: 05/22/2018 17:25      IMPRESSION AND PLAN:  COPD exacerbation and possible pneumonia. The patient will be admitted to medical floor.  Start IV Solu-Medrol, albuterol every 6 hours, Tessalon as needed for cough.  Continue doxycycline twice daily.  Chest  pain with elevated d-dimer, rule out PE.  VQ scan tomorrow.  Hypertension.  Continue home Norvasc.  All the records are reviewed and case discussed with ED provider. Management plans discussed with the patient, her husband And they are in agreement.  CODE STATUS: Full code  TOTAL TIME TAKING CARE OF THIS PATIENT: 43 minutes.    Shaune Pollack M.D on 05/22/2018 at 7:52 PM  Between 7am to 6pm - Pager - [phone]  After 6pm go to [email or web address] - Social research officer, government  [facility]  Office  [phone]  CC: Primary care physician; Hillery Aldo, MD   Note: This dictation was prepared with Dragon dictation along with smaller phrase technology. Any transcriptional errors that result from this process are unin

## 2018-05-22 NOTE — ED Notes (Signed)
Patient up to bathroom with assistance of this RN.  Patient slightly "shaky" and asked for assistance.

## 2018-05-22 NOTE — ED Notes (Signed)
Pt placed on cardiac monitor 

## 2018-05-23 ENCOUNTER — Encounter: Payer: Self-pay | Admitting: Radiology

## 2018-05-23 ENCOUNTER — Inpatient Hospital Stay: Payer: Medicare HMO

## 2018-05-23 DIAGNOSIS — J441 Chronic obstructive pulmonary disease with (acute) exacerbation: Secondary | ICD-10-CM | POA: Diagnosis not present

## 2018-05-23 LAB — BASIC METABOLIC PANEL
ANION GAP: 10 (ref 5–15)
BUN: 15 mg/dL (ref 8–23)
CHLORIDE: 107 mmol/L (ref 98–111)
CO2: 24 mmol/L (ref 22–32)
CREATININE: 0.91 mg/dL (ref 0.44–1.00)
Calcium: 9.1 mg/dL (ref 8.9–10.3)
GFR calc Af Amer: >60 mL/min (ref >60)
GFR, EST NON AFRICAN AMERICAN: 57 mL/min — AB (ref >60)
Glucose, Bld: 218 mg/dL — ABNORMAL HIGH (ref 70–99)
POTASSIUM: 3.7 mmol/L (ref 3.5–5.1)
Sodium: 141 mmol/L (ref 135–145)

## 2018-05-23 LAB — CBC
HEMATOCRIT: 34.1 % — AB (ref 35.0–47.0)
Hemoglobin: 12 g/dL (ref 12.0–16.0)
MCH: 29.2 pg (ref 26.0–34.0)
MCHC: 35.1 g/dL (ref 32.0–36.0)
MCV: 83.1 fL (ref 80.0–100.0)
PLATELETS: 228 10*3/uL (ref 150–440)
RBC: 4.1 MIL/uL (ref 3.80–5.20)
RDW: 14.8 % — ABNORMAL HIGH (ref 11.5–14.5)
WBC: 9.5 10*3/uL (ref 3.6–11.0)

## 2018-05-23 MED ORDER — TECHNETIUM TO 99M ALBUMIN AGGREGATED
4.0000 | Freq: Once | INTRAVENOUS | Status: AC | PRN
  Administered 2018-05-23: 12:00:00 4.29 via INTRAVENOUS

## 2018-05-23 MED ORDER — DOXYCYCLINE HYCLATE 100 MG PO TABS
100.0000 mg | ORAL_TABLET | Freq: Two times a day (BID) | ORAL | Status: DC
  Administered 2018-05-23 – 2018-05-24 (×3): 100 mg via ORAL
  Filled 2018-05-23 (×4): qty 1

## 2018-05-23 MED ORDER — TECHNETIUM TC 99M DIETHYLENETRIAME-PENTAACETIC ACID
30.0000 | Freq: Once | INTRAVENOUS | Status: AC | PRN
  Administered 2018-05-23: 12:00:00 32.954 via RESPIRATORY_TRACT

## 2018-05-23 NOTE — Progress Notes (Signed)
[facility] - [place] at [facility]   PATIENT NAME: Brooke Perez    MR#:  [id number]  DATE OF BIRTH:  [birth year]  SUBJECTIVE:  CHIEF COMPLAINT:   Chief Complaint  Patient presents with  . Cough  . Chest Pain   Came with complaint of chest pain, noted to have infiltrate and also had some COPD exacerbation.  D-dimer was high but patient have allergy to dye. Feels better today. REVIEW OF SYSTEMS:  CONSTITUTIONAL: No fever, fatigue or weakness.  EYES: No blurred or double vision.  EARS, NOSE, AND THROAT: No tinnitus or ear pain.  RESPIRATORY: No cough, shortness of breath, wheezing or hemoptysis.  CARDIOVASCULAR: have chest pain,no orthopnea, edema.  GASTROINTESTINAL: No nausea, vomiting, diarrhea or abdominal pain.  GENITOURINARY: No dysuria, hematuria.  ENDOCRINE: No polyuria, nocturia,  HEMATOLOGY: No anemia, easy bruising or bleeding SKIN: No rash or lesion. MUSCULOSKELETAL: No joint pain or arthritis.   NEUROLOGIC: No tingling, numbness, weakness.  PSYCHIATRY: No anxiety or depression.   ROS  DRUG ALLERGIES:   Allergies  Allergen Reactions  . Alprazolam     unknown  . Aspirin     unknown  . Atenolol     unknown  . Atrovent [Ipratropium]     unknown  . Ciprofloxacin     unknown  . Clonidine Derivatives     unknown  . Codeine     unknown  . Contrast Media [Iodinated Diagnostic Agents]     unknown  . Coumadin [Warfarin Sodium]     unknown  . Darvon [Propoxyphene Hcl]     unknown  . Demerol [Meperidine]     unknown  . Flovent [Fluticasone]     unknown  . Guiatuss [Guaifenesin]     unknown  . Iohexol     unknown  . Keflex [Cephalexin]     unknown  . Morphine And Related     unknown  . Nitroglycerin     unknown  . Parafon Forte Dsc [Chlorzoxazone]     unknown  . Plavix [Clopidogrel Bisulfate]     unknown  . Prednisone (Pak) [Prednisone] Other (See Comments)    coughing  . Pyrimethamine     unknown  . Sulfa Antibiotics    unknown  . Zithromax [Azithromycin]     unknown    VITALS:  Blood pressure 140/89, pulse 89, temperature 98.3 F (36.8 C), resp. rate 18, height 5\' 6"  (1.676 m), weight 63.5 kg, SpO2 94 %.  PHYSICAL EXAMINATION:  GENERAL:  84 y.o.-year-old patient lying in the bed with no acute distress.  EYES: Pupils equal, round, reactive to light and accommodation. No scleral icterus. Extraocular muscles intact.  HEENT: Head atraumatic, normocephalic. Oropharynx and nasopharynx clear.  NECK:  Supple, no jugular venous distention. No thyroid enlargement, no tenderness.  LUNGS: Normal breath sounds bilaterally, some wheezing, some crepitation. No use of accessory muscles of respiration.  CARDIOVASCULAR: S1, S2 normal. No murmurs, rubs, or gallops.  ABDOMEN: Soft, nontender, nondistended. Bowel sounds present. No organomegaly or mass.  EXTREMITIES: No pedal edema, cyanosis, or clubbing.  NEUROLOGIC: Cranial nerves II through XII are intact. Muscle strength 5/5 in all extremities. Sensation intact. Gait not checked.  PSYCHIATRIC: The patient is alert and oriented x 3.  SKIN: No obvious rash, lesion, or ulcer.   Physical Exam LABORATORY PANEL:   CBC Recent Labs  Lab 05/23/18 0441  WBC 9.5  HGB 12.0  HCT 34.1*  PLT 228   ------------------------------------------------------------------------------------------------------------------  Chemistries  Recent Labs  Lab  05/22/18 1700 05/23/18 0441  NA 142 141  K 3.9 3.7  CL 107 107  CO2 27 24  GLUCOSE 118* 218*  BUN 14 15  CREATININE 0.97 0.91  CALCIUM 9.4 9.1  MG 2.1  --    ------------------------------------------------------------------------------------------------------------------  Cardiac Enzymes Recent Labs  Lab 05/22/18 1700  TROPONINI <0.03   ------------------------------------------------------------------------------------------------------------------  RADIOLOGY:  Dg Chest 2 View  Result Date: 05/22/2018 CLINICAL  DATA:  Cough, chest pain EXAM: CHEST - 2 VIEW COMPARISON:  Chest radiograph 12/21/2017 FINDINGS: Stable cardiac contours. Tortuosity of the thoracic aorta. Heterogeneous opacities left lung base. No pleural effusion or pneumothorax. Regional skeleton is unremarkable. IMPRESSION: Heterogeneous opacities left lung base may represent atelectasis or infection. Electronically Signed   By: Annia Beltrew  Davis M.D.   On: 05/22/2018 17:25   Nm Pulmonary Perf And Vent  Result Date: 05/23/2018 CLINICAL DATA:  Shortness of breath EXAM: NUCLEAR MEDICINE VENTILATION - PERFUSION LUNG SCAN TECHNIQUE: Ventilation images were obtained in multiple projections using inhaled aerosol Tc-3865m DTPA. Perfusion images were obtained in multiple projections after intravenous injection of Tc-3865m-MAA. RADIOPHARMACEUTICALS:  33.0 mCi of Tc-6265m DTPA aerosol inhalation and 4.3 mCi Tc7665m-MAA IV COMPARISON:  Chest x-ray 05/22/2018 FINDINGS: Ventilation: No focal ventilation defect. Perfusion: No wedge shaped peripheral perfusion defects to suggest acute pulmonary embolism. IMPRESSION: No evidence of pulmonary embolus. Electronically Signed   By: Charlett NoseKevin  Dover M.D.   On: 05/23/2018 12:16   [place] Venous Img Lower Bilateral  Result Date: 05/22/2018 CLINICAL DATA:  Elevated D-dimer. Bilateral lower extremity swelling for several months. EXAM: BILATERAL LOWER EXTREMITY VENOUS DOPPLER ULTRASOUND TECHNIQUE: Gray-scale sonography with graded compression, as well as color Doppler and duplex ultrasound were performed to evaluate the lower extremity deep venous systems from the level of the common femoral vein and including the common femoral, femoral, profunda femoral, popliteal and calf veins including the posterior tibial, peroneal and gastrocnemius veins when visible. The superficial great saphenous vein was also interrogated. Spectral Doppler was utilized to evaluate flow at rest and with distal augmentation maneuvers in the common femoral, femoral and  popliteal veins. COMPARISON:  None. FINDINGS: RIGHT LOWER EXTREMITY Common Femoral Vein: No evidence of thrombus. Normal compressibility, respiratory phasicity and response to augmentation. Saphenofemoral Junction: No evidence of thrombus. Normal compressibility and flow on color Doppler imaging. Profunda Femoral Vein: No evidence of thrombus. Normal compressibility and flow on color Doppler imaging. Femoral Vein: No evidence of thrombus. Normal compressibility, respiratory phasicity and response to augmentation. Popliteal Vein: No evidence of thrombus. Normal compressibility, respiratory phasicity and response to augmentation. Calf Veins: No evidence of thrombus. Normal compressibility and flow on color Doppler imaging. Superficial Great Saphenous Vein: No evidence of thrombus. Normal compressibility. Venous Reflux:  None. Other Findings:  None. LEFT LOWER EXTREMITY Common Femoral Vein: No evidence of thrombus. Normal compressibility, respiratory phasicity and response to augmentation. Saphenofemoral Junction: No evidence of thrombus. Normal compressibility and flow on color Doppler imaging. Profunda Femoral Vein: No evidence of thrombus. Normal compressibility and flow on color Doppler imaging. Femoral Vein: No evidence of thrombus. Normal compressibility, respiratory phasicity and response to augmentation. Popliteal Vein: No evidence of thrombus. Normal compressibility, respiratory phasicity and response to augmentation. Calf Veins: No evidence of thrombus. Normal compressibility and flow on color Doppler imaging. Superficial Great Saphenous Vein: No evidence of thrombus. Normal compressibility. Venous Reflux:  None. Other Findings:  None. IMPRESSION: No evidence of deep venous thrombosis. Electronically Signed   By: Bary RichardStan  Maynard M.D.   On: 05/22/2018 21:42  ASSESSMENT AND PLAN:   Active Problems:   COPD exacerbation (HCC)  * COPD exacerbation and possible pneumonia. IV Solu-Medrol, albuterol every 6  hours, Tessalon as needed for cough.  Continue doxycycline twice daily.  * Chest pain with elevated d-dimer, rule out PE.  VQ scan tomorrow.  * Hypertension.  Continue home Norvasc.   All the records are reviewed and case discussed with Care Management/Social Workerr. Management plans discussed with the patient, family and they are in agreement.  CODE STATUS: full.  TOTAL TIME TAKING CARE OF THIS PATIENT: 35 minutes.     POSSIBLE D/C IN 1-2 DAYS, DEPENDING ON CLINICAL CONDITION.   Altamese DillingVaibhavkumar Jebadiah Imperato M.D on 05/23/2018   Between 7am to 6pm - Pager - [phone]  After 6pm go to [email or web address] - password [email or web address]  [facility] [place] [facility]  Office  [phone]  CC: Primary care physician; Hillery AldoPatel, Sarah, MD  Note: This dictation was prepared with Dragon dictation along with smaller phrase technology. Any transcriptional errors that result from this process are unintentional.

## 2018-05-23 NOTE — Progress Notes (Signed)
Chaplain received OR for prayer. Chaplain went to patient room and introduce herself as on-called Chaplain and patient was complaining about acid reflex. She was waiting for nurse to come. Chaplain ask was this a good time to visit for prayer. Patient said yes and Chaplain prayed with patient and insure she would check at nurses station for her to tell her nurse she needed her. Patient said thank you very much and thanks for prayer she believe in prayer and angels.

## 2018-05-24 DIAGNOSIS — J441 Chronic obstructive pulmonary disease with (acute) exacerbation: Secondary | ICD-10-CM | POA: Diagnosis not present

## 2018-05-24 MED ORDER — DOXYCYCLINE HYCLATE 100 MG PO TABS: 100.0000 mg | ORAL_TABLET | Freq: Two times a day (BID) | ORAL | 0 refills | Status: AC

## 2018-05-24 NOTE — Care Management CC44 (Signed)
Condition Code 44 Documentation Completed  Patient Details  Name: Pasty ArchBarbara A Postlewait MRN: [id number] Date of Birth: 1932-12-11   Condition Code 44 given:    Patient signature on Condition Code 44 notice:    Documentation of 2 MD's agreement:    Code 44 added to claim:       Marily MemosLisa M Conlin Brahm, RN 05/24/2018, 11:42 AM

## 2018-05-24 NOTE — Care Management Obs Status (Signed)
MEDICARE OBSERVATION STATUS NOTIFICATION   Patient Details  Name: Brooke ArchBarbara A Scotti MRN: [id number] Date of Birth: 1933/08/30   Medicare Observation Status Notification Given:       Marily MemosLisa M Yahmir Sokolov, RN 05/24/2018, 11:42 AM

## 2018-05-24 NOTE — Progress Notes (Signed)
Pt is being discharged home. Discharge papers given and explained to pt. Pt verbalized understanding. Meds and f/u appointment reviewed. RX given.  

## 2018-05-27 LAB — CULTURE, BLOOD (ROUTINE X 2)
CULTURE: NO GROWTH
CULTURE: NO GROWTH
SPECIAL REQUESTS: ADEQUATE

## 2018-05-31 NOTE — Discharge Summary (Signed)
[facility] - [place] at [facility]   PATIENT NAME: Brooke Perez    MR#:  [id number]  DATE OF BIRTH:  Mar 03, 1933  DATE OF ADMISSION:  05/22/2018 ADMITTING PHYSICIAN: Shaune Pollack, MD  DATE OF DISCHARGE: 05/24/2018  2:11 PM  PRIMARY CARE PHYSICIAN: Hillery Aldo, MD    ADMISSION DIAGNOSIS:  Pleuritic chest pain [R07.81] Positive D dimer [R79.89] COPD with acute exacerbation (HCC) [J44.1]  DISCHARGE DIAGNOSIS:  Active Problems:   COPD exacerbation (HCC)   COPD with acute exacerbation (HCC)   SECONDARY DIAGNOSIS:   Past Medical History:  Diagnosis Date  . Allergy 2012   ? benzimidazole, IV dye  . Arthritis    since 1997  . Asthma    Per patient.  . Blood in stool   . Breast screening, unspecified   . COPD (chronic obstructive pulmonary disease) (HCC)   . H/O blood clots 2004  . History of cholecystitis 1986  . Hypertension 1997   since 1997  . PE (pulmonary thromboembolism) (HCC)    2004  . Prolapse of intestine   . Special screening for malignant neoplasms, colon     HOSPITAL COURSE:   * COPD exacerbation and possible pneumonia. IV Solu-Medrol, albuterol every 6 hours, Tessalon as needed for cough.Continue doxycycline twice daily.  * Chest pain with elevated d-dimer, rule out PE. VQ scan negative.  * Hypertension. Continue home Norvasc.   DISCHARGE CONDITIONS:   Stable.  CONSULTS OBTAINED:    DRUG ALLERGIES:   Allergies  Allergen Reactions  . Alprazolam     unknown  . Aspirin     unknown  . Atenolol     unknown  . Atrovent [Ipratropium]     unknown  . Ciprofloxacin     unknown  . Clonidine Derivatives     unknown  . Codeine     unknown  . Contrast Media [Iodinated Diagnostic Agents]     unknown  . Coumadin [Warfarin Sodium]     unknown  . Darvon [Propoxyphene Hcl]     unknown  . Demerol [Meperidine]     unknown  . Flovent [Fluticasone]     unknown  . Guiatuss [Guaifenesin]     unknown  . Iohexol      unknown  . Keflex [Cephalexin]     unknown  . Morphine And Related     unknown  . Nitroglycerin     unknown  . Parafon Forte Dsc [Chlorzoxazone]     unknown  . Plavix [Clopidogrel Bisulfate]     unknown  . Prednisone (Pak) [Prednisone] Other (See Comments)    coughing  . Pyrimethamine     unknown  . Sulfa Antibiotics     unknown  . Zithromax [Azithromycin]     unknown    DISCHARGE MEDICATIONS:   Allergies as of 05/24/2018      Reactions   Alprazolam    unknown   Aspirin    unknown   Atenolol    unknown   Atrovent [ipratropium]    unknown   Ciprofloxacin    unknown   Clonidine Derivatives    unknown   Codeine    unknown   Contrast Media [iodinated Diagnostic Agents]    unknown   Coumadin [warfarin Sodium]    unknown   Darvon [propoxyphene Hcl]    unknown   Demerol [meperidine]    unknown   Flovent [fluticasone]    unknown   Guiatuss [guaifenesin]    unknown   Iohexol  unknown   Keflex [cephalexin]    unknown   Morphine And Related    unknown   Nitroglycerin    unknown   Parafon Forte Dsc [chlorzoxazone]    unknown   Plavix [clopidogrel Bisulfate]    unknown   Prednisone (pak) [prednisone] Other (See Comments)   coughing   Pyrimethamine    unknown   Sulfa Antibiotics    unknown   Zithromax [azithromycin]    unknown      Medication List    TAKE these medications   acetaminophen 325 MG tablet Commonly known as:  TYLENOL Take 325-650 mg by mouth every 6 (six) hours as needed for mild pain or headache.   albuterol (2.5 MG/3ML) 0.083% nebulizer solution Commonly known as:  PROVENTIL Take 2.5 mg by nebulization every 6 (six) hours as needed for wheezing or shortness of breath.   VENTOLIN HFA 108 (90 Base) MCG/ACT inhaler Generic drug:  albuterol Inhale 2 puffs into the lungs every 6 (six) hours as needed for wheezing or shortness of breath.   amLODipine 5 MG tablet Commonly known as:  NORVASC Take 2.5 mg by mouth daily.    calcium carbonate 500 MG chewable tablet Commonly known as:  TUMS - dosed in mg elemental calcium Chew 2 tablets by mouth daily as needed for indigestion or heartburn.   cholecalciferol 1000 units tablet Commonly known as:  VITAMIN D Take 1,000 Units by mouth daily.   levothyroxine 75 MCG tablet Commonly known as:  SYNTHROID, LEVOTHROID Take 75 mcg by mouth daily before breakfast.   pantoprazole 40 MG tablet Commonly known as:  PROTONIX Take 1 tablet (40 mg total) by mouth daily.     ASK your doctor about these medications   doxycycline 100 MG tablet Commonly known as:  VIBRA-TABS Take 1 tablet (100 mg total) by mouth 2 (two) times daily with a meal for 2 days. Ask about: Should I take this medication?        DISCHARGE INSTRUCTIONS:    Follow with PMD in 1-2 weeks.  If you experience worsening of your admission symptoms, develop shortness of breath, life threatening emergency, suicidal or homicidal thoughts you must seek medical attention immediately by calling 911 or calling your MD immediately  if symptoms less severe.  You Must read complete instructions/literature along with all the possible adverse reactions/side effects for all the Medicines you take and that have been prescribed to you. Take any new Medicines after you have completely understood and accept all the possible adverse reactions/side effects.   Please note  You were cared for by a hospitalist during your hospital stay. If you have any questions about your discharge medications or the care you received while you were in the hospital after you are discharged, you can call the unit and asked to speak with the hospitalist on call if the hospitalist that took care of you is not available. Once you are discharged, your primary care physician will handle any further medical issues. Please note that NO REFILLS for any discharge medications will be authorized once you are discharged, as it is imperative that you  return to your primary care physician (or establish a relationship with a primary care physician if you do not have one) for your aftercare needs so that they can reassess your need for medications and monitor your lab values.    Today   CHIEF COMPLAINT:   Chief Complaint  Patient presents with  . Cough  . Chest Pain  HISTORY OF PRESENT ILLNESS:  Kandee Escalante  is a 84 y.o. female with a known history of COPD, hypertension, PE, arthritis and prolapse of intestine.  The patient presents to the ED with above chief complaints.  She has had shortness of breath, wheezing and cough for 2 to 3 days.  She complains of chest pain while coughing and taking deep breaths.  She also complains of fever, chills, dizziness.  Chest x-ray suspect atelectasis or infection.  She is treated with IV steroid and nebulizer in the ED.  She still has shortness of breath.  Her d-dimer is elevated but she has allergy to contrast, unable to get a CT angiogram to rule out PE.  VITAL SIGNS:  Blood pressure (!) 150/82, pulse 76, temperature 97.8 F (36.6 C), temperature source Oral, resp. rate 18, height 5\' 6"  (1.676 m), weight 63.5 kg, SpO2 97 %.  I/O:  No intake or output data in the 24 hours ending 05/31/18 0002  PHYSICAL EXAMINATION:   GENERAL:  84 y.o.-year-old patient lying in the bed with no acute distress.  EYES: Pupils equal, round, reactive to light and accommodation. No scleral icterus. Extraocular muscles intact.  HEENT: Head atraumatic, normocephalic. Oropharynx and nasopharynx clear.  NECK:  Supple, no jugular venous distention. No thyroid enlargement, no tenderness.  LUNGS: Normal breath sounds bilaterally, some wheezing, some crepitation. No use of accessory muscles of respiration.  CARDIOVASCULAR: S1, S2 normal. No murmurs, rubs, or gallops.  ABDOMEN: Soft, nontender, nondistended. Bowel sounds present. No organomegaly or mass.  EXTREMITIES: No pedal edema, cyanosis, or clubbing.  NEUROLOGIC:  Cranial nerves II through XII are intact. Muscle strength 5/5 in all extremities. Sensation intact. Gait not checked.  PSYCHIATRIC: The patient is alert and oriented x 3.  SKIN: No obvious rash, lesion, or ulcer.   DATA REVIEW:   CBC No results for input(s): WBC, HGB, HCT, PLT in the last 168 hours.  Chemistries  No results for input(s): NA, K, CL, CO2, GLUCOSE, BUN, CREATININE, CALCIUM, MG, AST, ALT, ALKPHOS, BILITOT in the last 168 hours.  Invalid input(s): GFRCGP  Cardiac Enzymes No results for input(s): TROPONINI in the last 168 hours.  Microbiology Results  Results for orders placed or performed during the hospital encounter of 05/22/18  Blood Culture (routine x 2)     Status: None   Collection Time: 05/22/18  6:13 PM  Result Value Ref Range Status   Specimen Description BLOOD RIGHT AC  Final   Special Requests   Final    BOTTLES DRAWN AEROBIC AND ANAEROBIC Blood Culture results may not be optimal due to an excessive volume of blood received in culture bottles   Culture   Final    NO GROWTH 5 DAYS Performed at [facility], [address], [place], [place] [address]    Report Status 05/27/2018 FINAL  Final  Blood Culture (routine x 2)     Status: None   Collection Time: 05/22/18  6:13 PM  Result Value Ref Range Status   Specimen Description BLOOD LEFT [facility]  Final   Special Requests   Final    BOTTLES DRAWN AEROBIC AND ANAEROBIC Blood Culture adequate volume   Culture   Final    NO GROWTH 5 DAYS Performed at [facility], [address], [place], [place] [address]    Report Status 05/27/2018 FINAL  Final    RADIOLOGY:  No results found.  EKG:   Orders placed or performed during the hospital encounter of 05/22/18  .  ED EKG within 10 minutes  . ED EKG within 10 minutes  . EKG    Management plans discussed with the patient, family and they are in agreement.  CODE STATUS:  Code Status History    Date Active Date Inactive Code Status Order ID  Comments User Context   05/22/2018 2132 05/24/2018 1711 Full Code [id number]  Shaune Pollack, MD Inpatient   12/21/2017 1213 12/22/2017 1650 Full Code [id number]  Enid Baas, MD Inpatient      TOTAL TIME TAKING CARE OF THIS PATIENT: 35 minutes.    Altamese Dilling M.D on 05/31/2018 at 12:02 AM  Between 7am to 6pm - Pager - [phone]  After 6pm go to [email or web address] - password [email or web address]  [facility] [place] [facility]  Office  [phone]  CC: Primary care physician; Hillery Aldo, MD   Note: This dictation was prepared with Dragon dictation along with smaller phrase technology. Any transcriptional errors that result from this process are unintentional.

## 2018-08-02 ENCOUNTER — Ambulatory Visit: Payer: Medicare HMO | Admitting: Occupational Therapy

## 2018-08-31 ENCOUNTER — Emergency Department: Payer: Medicare HMO

## 2018-08-31 ENCOUNTER — Emergency Department
Admission: EM | Admit: 2018-08-31 | Discharge: 2018-08-31 | Disposition: A | Discharge status: Home / Self Care | Payer: Medicare HMO | Attending: Emergency Medicine | Admitting: Emergency Medicine

## 2018-08-31 ENCOUNTER — Other Ambulatory Visit: Payer: Medicare HMO

## 2018-08-31 DIAGNOSIS — M25562 Pain in left knee: Secondary | ICD-10-CM | POA: Diagnosis not present

## 2018-08-31 DIAGNOSIS — I10 Essential (primary) hypertension: Secondary | ICD-10-CM | POA: Insufficient documentation

## 2018-08-31 DIAGNOSIS — Z79899 Other long term (current) drug therapy: Secondary | ICD-10-CM | POA: Insufficient documentation

## 2018-08-31 DIAGNOSIS — S20211A Contusion of right front wall of thorax, initial encounter: Secondary | ICD-10-CM | POA: Diagnosis not present

## 2018-08-31 DIAGNOSIS — Y9389 Activity, other specified: Secondary | ICD-10-CM | POA: Diagnosis not present

## 2018-08-31 DIAGNOSIS — W2210XA Striking against or struck by unspecified automobile airbag, initial encounter: Secondary | ICD-10-CM | POA: Insufficient documentation

## 2018-08-31 DIAGNOSIS — Y998 Other external cause status: Secondary | ICD-10-CM | POA: Insufficient documentation

## 2018-08-31 DIAGNOSIS — S161XXA Strain of muscle, fascia and tendon at neck level, initial encounter: Secondary | ICD-10-CM | POA: Diagnosis not present

## 2018-08-31 DIAGNOSIS — Y92410 Unspecified street and highway as the place of occurrence of the external cause: Secondary | ICD-10-CM | POA: Insufficient documentation

## 2018-08-31 DIAGNOSIS — J449 Chronic obstructive pulmonary disease, unspecified: Secondary | ICD-10-CM | POA: Diagnosis not present

## 2018-08-31 DIAGNOSIS — S29001A Unspecified injury of muscle and tendon of front wall of thorax, initial encounter: Secondary | ICD-10-CM | POA: Diagnosis present

## 2018-08-31 LAB — CBC WITH DIFFERENTIAL/PLATELET
Abs Immature Granulocytes: 0.14 10*3/uL — ABNORMAL HIGH (ref 0.00–0.07)
Basophils Absolute: 0.1 10*3/uL (ref 0.0–0.1)
Basophils Relative: 1 %
EOS ABS: 0.5 10*3/uL (ref 0.0–0.5)
EOS PCT: 5 %
HCT: 40.6 % (ref 36.0–46.0)
Hemoglobin: 13 g/dL (ref 12.0–15.0)
Immature Granulocytes: 2 %
Lymphocytes Relative: 30 %
Lymphs Abs: 2.8 10*3/uL (ref 0.7–4.0)
MCH: 27.4 pg (ref 26.0–34.0)
MCHC: 32 g/dL (ref 30.0–36.0)
MCV: 85.7 fL (ref 80.0–100.0)
MONO ABS: 0.9 10*3/uL (ref 0.1–1.0)
Monocytes Relative: 9 %
Neutro Abs: 5.2 10*3/uL (ref 1.7–7.7)
Neutrophils Relative %: 53 %
PLATELETS: 228 10*3/uL (ref 150–400)
RBC: 4.74 MIL/uL (ref 3.87–5.11)
RDW: 14.5 % (ref 11.5–15.5)
WBC: 9.5 10*3/uL (ref 4.0–10.5)
nRBC: 0 % (ref 0.0–0.2)

## 2018-08-31 LAB — BASIC METABOLIC PANEL
Anion gap: 9 (ref 5–15)
BUN: 15 mg/dL (ref 8–23)
CO2: 26 mmol/L (ref 22–32)
CREATININE: 0.85 mg/dL (ref 0.44–1.00)
Calcium: 9.6 mg/dL (ref 8.9–10.3)
Chloride: 106 mmol/L (ref 98–111)
GFR calc Af Amer: >60 mL/min (ref >60)
GFR calc non Af Amer: >60 mL/min (ref >60)
Glucose, Bld: 111 mg/dL — ABNORMAL HIGH (ref 70–99)
Potassium: 3.7 mmol/L (ref 3.5–5.1)
Sodium: 141 mmol/L (ref 135–145)

## 2018-08-31 LAB — PROTIME-INR
INR: 0.95
PROTHROMBIN TIME: 12.6 s (ref 11.4–15.2)

## 2018-08-31 LAB — TROPONIN I: Troponin I: <0.03 ng/mL (ref <0.03)

## 2018-08-31 MED ORDER — ACETAMINOPHEN 500 MG PO TABS
1000.0000 mg | ORAL_TABLET | Freq: Once | ORAL | Status: AC
  Administered 2018-08-31: 1000 mg via ORAL
  Filled 2018-08-31: qty 2

## 2018-08-31 NOTE — ED Notes (Signed)
Patient transported to CT 
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
## 2018-08-31 NOTE — ED Notes (Signed)
ED Provider at bedside. 

## 2018-08-31 NOTE — Discharge Instructions (Addendum)
Continue Tylenol and ice packs as needed for pain.  Return to the ER for new, worsening, persistent severe pain, shortness of breath, weakness or numbness, cough, fever, or any other new or worsening symptoms that concern you.
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
## 2018-09-28 ENCOUNTER — Ambulatory Visit
Admission: RE | Admit: 2018-09-28 | Discharge: 2018-09-28 | Disposition: A | Discharge status: Home / Self Care | Payer: Medicare HMO | Attending: Family Medicine | Admitting: Family Medicine

## 2018-09-28 ENCOUNTER — Other Ambulatory Visit: Payer: Self-pay | Admitting: Family Medicine

## 2018-09-28 ENCOUNTER — Ambulatory Visit
Admission: RE | Admit: 2018-09-28 | Discharge: 2018-09-28 | Disposition: A | Discharge status: Home / Self Care | Payer: Medicare HMO | Source: Ambulatory Visit | Attending: Family Medicine | Admitting: Family Medicine

## 2018-09-28 DIAGNOSIS — R0789 Other chest pain: Secondary | ICD-10-CM

## 2018-09-28 DIAGNOSIS — S20211D Contusion of right front wall of thorax, subsequent encounter: Secondary | ICD-10-CM | POA: Insufficient documentation

## 2018-09-28 DIAGNOSIS — S2221XA Fracture of manubrium, initial encounter for closed fracture: Secondary | ICD-10-CM | POA: Diagnosis not present

## 2018-10-19 DIAGNOSIS — R9431 Abnormal electrocardiogram [ECG] [EKG]: Secondary | ICD-10-CM | POA: Insufficient documentation

## 2018-10-19 DIAGNOSIS — R072 Precordial pain: Secondary | ICD-10-CM | POA: Insufficient documentation

## 2018-11-30 ENCOUNTER — Other Ambulatory Visit: Payer: Self-pay | Admitting: Family Medicine

## 2018-11-30 DIAGNOSIS — S2221XG Fracture of manubrium, subsequent encounter for fracture with delayed healing: Secondary | ICD-10-CM

## 2018-12-04 ENCOUNTER — Ambulatory Visit
Admission: RE | Admit: 2018-12-04 | Discharge: 2018-12-04 | Disposition: A | Discharge status: Home / Self Care | Payer: Medicare HMO | Source: Ambulatory Visit | Attending: Family Medicine | Admitting: Family Medicine

## 2018-12-04 DIAGNOSIS — S2221XG Fracture of manubrium, subsequent encounter for fracture with delayed healing: Secondary | ICD-10-CM | POA: Insufficient documentation

## 2018-12-12 ENCOUNTER — Other Ambulatory Visit: Payer: Self-pay | Admitting: Family Medicine

## 2018-12-12 DIAGNOSIS — M542 Cervicalgia: Secondary | ICD-10-CM

## 2018-12-15 ENCOUNTER — Ambulatory Visit
Admission: RE | Admit: 2018-12-15 | Discharge: 2018-12-15 | Disposition: A | Discharge status: Home / Self Care | Payer: Medicare HMO | Attending: Family Medicine | Admitting: Family Medicine

## 2018-12-15 ENCOUNTER — Ambulatory Visit
Admission: RE | Admit: 2018-12-15 | Discharge: 2018-12-15 | Disposition: A | Discharge status: Home / Self Care | Payer: Medicare HMO | Source: Ambulatory Visit | Attending: Family Medicine | Admitting: Family Medicine

## 2018-12-15 DIAGNOSIS — M542 Cervicalgia: Secondary | ICD-10-CM

## 2019-05-11 ENCOUNTER — Other Ambulatory Visit: Payer: Self-pay | Admitting: Family Medicine

## 2019-05-11 DIAGNOSIS — R1310 Dysphagia, unspecified: Secondary | ICD-10-CM

## 2019-05-11 DIAGNOSIS — R490 Dysphonia: Secondary | ICD-10-CM

## 2019-05-18 ENCOUNTER — Ambulatory Visit: Payer: Medicare HMO

## 2019-05-22 ENCOUNTER — Ambulatory Visit: Payer: Medicare HMO

## 2019-05-29 ENCOUNTER — Other Ambulatory Visit: Payer: Self-pay

## 2019-05-29 ENCOUNTER — Ambulatory Visit
Admission: RE | Admit: 2019-05-29 | Discharge: 2019-05-29 | Disposition: A | Discharge status: Home / Self Care | Payer: Medicare HMO | Source: Ambulatory Visit | Attending: Family Medicine | Admitting: Family Medicine

## 2019-05-29 DIAGNOSIS — R1310 Dysphagia, unspecified: Secondary | ICD-10-CM | POA: Insufficient documentation

## 2019-05-29 DIAGNOSIS — R490 Dysphonia: Secondary | ICD-10-CM | POA: Insufficient documentation

## 2019-08-20 ENCOUNTER — Other Ambulatory Visit: Payer: Self-pay

## 2019-08-20 ENCOUNTER — Emergency Department
Admission: EM | Admit: 2019-08-20 | Discharge: 2019-08-20 | Disposition: A | Discharge status: Home / Self Care | Payer: Medicare HMO | Attending: Emergency Medicine | Admitting: Emergency Medicine

## 2019-08-20 ENCOUNTER — Emergency Department: Payer: Medicare HMO

## 2019-08-20 DIAGNOSIS — I10 Essential (primary) hypertension: Secondary | ICD-10-CM | POA: Insufficient documentation

## 2019-08-20 DIAGNOSIS — Z20828 Contact with and (suspected) exposure to other viral communicable diseases: Secondary | ICD-10-CM | POA: Insufficient documentation

## 2019-08-20 DIAGNOSIS — R0789 Other chest pain: Secondary | ICD-10-CM | POA: Insufficient documentation

## 2019-08-20 DIAGNOSIS — J449 Chronic obstructive pulmonary disease, unspecified: Secondary | ICD-10-CM | POA: Insufficient documentation

## 2019-08-20 DIAGNOSIS — R0602 Shortness of breath: Secondary | ICD-10-CM | POA: Insufficient documentation

## 2019-08-20 DIAGNOSIS — Z86711 Personal history of pulmonary embolism: Secondary | ICD-10-CM | POA: Insufficient documentation

## 2019-08-20 LAB — BASIC METABOLIC PANEL
Anion gap: 12 (ref 5–15)
BUN: 11 mg/dL (ref 8–23)
CO2: 26 mmol/L (ref 22–32)
Calcium: 10 mg/dL (ref 8.9–10.3)
Chloride: 105 mmol/L (ref 98–111)
Creatinine, Ser: 0.86 mg/dL (ref 0.44–1.00)
GFR calc Af Amer: >60 mL/min (ref >60)
GFR calc non Af Amer: >60 mL/min (ref >60)
Glucose, Bld: 115 mg/dL — ABNORMAL HIGH (ref 70–99)
Potassium: 3.9 mmol/L (ref 3.5–5.1)
Sodium: 143 mmol/L (ref 135–145)

## 2019-08-20 LAB — CBC
HCT: 38.6 % (ref 36.0–46.0)
Hemoglobin: 12.9 g/dL (ref 12.0–15.0)
MCH: 27.6 pg (ref 26.0–34.0)
MCHC: 33.4 g/dL (ref 30.0–36.0)
MCV: 82.7 fL (ref 80.0–100.0)
Platelets: 218 10*3/uL (ref 150–400)
RBC: 4.67 MIL/uL (ref 3.87–5.11)
RDW: 14.2 % (ref 11.5–15.5)
WBC: 8.3 10*3/uL (ref 4.0–10.5)
nRBC: 0 % (ref 0.0–0.2)

## 2019-08-20 LAB — TROPONIN I (HIGH SENSITIVITY): Troponin I (High Sensitivity): 6 ng/L (ref <18)

## 2019-08-20 LAB — SARS CORONAVIRUS 2 BY RT PCR (HOSPITAL ORDER, PERFORMED IN ~~LOC~~ HOSPITAL LAB): SARS Coronavirus 2: NEGATIVE

## 2019-08-20 LAB — BRAIN NATRIURETIC PEPTIDE: B Natriuretic Peptide: 156 pg/mL — ABNORMAL HIGH (ref 0.0–100.0)

## 2019-08-20 LAB — FIBRIN DERIVATIVES D-DIMER (ARMC ONLY): Fibrin derivatives D-dimer (ARMC): 1068.13 ng/mL (FEU) — ABNORMAL HIGH (ref 0.00–499.00)

## 2019-08-20 MED ORDER — TECHNETIUM TC 99M DIETHYLENETRIAME-PENTAACETIC ACID
30.0000 | Freq: Once | INTRAVENOUS | Status: AC | PRN
  Administered 2019-08-20: 30.48 via INTRAVENOUS

## 2019-08-20 MED ORDER — TECHNETIUM TO 99M ALBUMIN AGGREGATED
4.0000 | Freq: Once | INTRAVENOUS | Status: AC | PRN
  Administered 2019-08-20: 3.99 via INTRAVENOUS

## 2019-08-20 MED ORDER — SODIUM CHLORIDE 0.9% FLUSH: 3.0000 mL | Freq: Once | INTRAVENOUS | Status: DC

## 2019-08-20 MED ORDER — ALBUTEROL SULFATE (2.5 MG/3ML) 0.083% IN NEBU
2.5000 mg | INHALATION_SOLUTION | Freq: Once | RESPIRATORY_TRACT | Status: AC
  Administered 2019-08-20: 2.5 mg via RESPIRATORY_TRACT
  Filled 2019-08-20: qty 3

## 2019-08-20 NOTE — ED Notes (Signed)
Urine sent to the lab

## 2019-08-20 NOTE — ED Provider Notes (Signed)
Patient's d-dimer came back elevated. Unable to get CT scan given IV contrast allergy and allergy to steroids. V/Q scan was obtained. Patient was tested for covid which came back negative prior to scan being obtained. V/Q scan was negative for PE. Patient did feel she felt significantly improved at the time of my reassessment. Will plan on discharging home.    Nance Pear, MD 08/20/19 2142

## 2019-08-20 NOTE — ED Notes (Signed)
[facility] on call tech called per this RN, states to call them back once Covid swab has resulted.    [phone]

## 2019-08-20 NOTE — ED Notes (Signed)
Updated nephew per patient's permission

## 2019-08-20 NOTE — ED Provider Notes (Signed)
[facility] Emergency Department Provider Note       Time seen: ----------------------------------------- 1:27 PM on 08/20/2019 -----------------------------------------   I have reviewed the triage vital signs and the nursing notes.  HISTORY   Chief Complaint Shortness of Breath    HPI Brooke Perez is a 85 y.o. female with a history of allergies, asthma, COPD, hypertension, PE who presents to the ED for shortness of breath and chest tightness since 530 this morning.  Patient describes 10 out of 10 pain.  Past Medical History:  Diagnosis Date  . Allergy 2012   ? benzimidazole, IV dye  . Arthritis    since 1997  . Asthma    Per patient.  . Blood in stool   . Breast screening, unspecified   . COPD (chronic obstructive pulmonary disease) ([place])   . H/O blood clots 2004  . History of cholecystitis 1986  . Hypertension 1997   since 1997  . PE (pulmonary thromboembolism) ([place])    2004  . Prolapse of intestine   . Special screening for malignant neoplasms, colon     Patient Active Problem List   Diagnosis Date Noted  . COPD with acute exacerbation ([place]) 05/23/2018  . COPD exacerbation ([place]) 05/22/2018  . Chest pain 12/21/2017  . Dyspnea 10/29/2013  . H/O blood clots     Past Surgical History:  Procedure Laterality Date  . ABDOMINAL HYSTERECTOMY  1968  . broken hand Right 1986  . CHOLECYSTECTOMY  1999   h/o cholecystitis in 1986  . COLON SURGERY  2000  . COLONOSCOPY W/ POLYPECTOMY  12/25/2010   one 96mm semisessile polyp was found in the descending colon  . goiter removal  1965  . TONSILLECTOMY  1954  . TUBAL LIGATION  1968    Allergies Alprazolam, Aspirin, Atenolol, Atrovent [ipratropium], Ciprofloxacin, Clonidine derivatives, Codeine, Contrast media [iodinated diagnostic agents], Coumadin [warfarin sodium], Darvon [propoxyphene hcl], Demerol [meperidine], Flovent [fluticasone], Guiatuss [guaifenesin], Iohexol, Keflex [cephalexin],  Morphine and related, Nitroglycerin, Parafon forte dsc [chlorzoxazone], Plavix [clopidogrel bisulfate], Prednisone (pak) [prednisone], Pyrimethamine, Sulfa antibiotics, and Zithromax [azithromycin]  Social History Social History   Tobacco Use  . Smoking status: Never Smoker  . Smokeless tobacco: Never Used  Substance Use Topics  . Alcohol use: No    Comment: never alcoholic  . Drug use: No   Review of Systems Constitutional: Negative for fever. Cardiovascular: Positive for chest tightness Respiratory: Positive for shortness of breath Gastrointestinal: Negative for abdominal pain, vomiting and diarrhea. Musculoskeletal: Negative for back pain. Skin: Negative for rash. Neurological: Negative for headaches, focal weakness or numbness.  All systems negative/normal/unremarkable except as stated in the HPI  ____________________________________________   PHYSICAL EXAM:  VITAL SIGNS: ED Triage Vitals  Enc Vitals Group     BP 08/20/19 1117 (!) 174/102     Pulse Rate 08/20/19 1115 82     Resp 08/20/19 1115 18     Temp 08/20/19 1118 97.8 F (36.6 C)     Temp Source 08/20/19 1115 Oral     SpO2 08/20/19 1115 99 %     Weight 08/20/19 1116 135 lb (61.2 kg)     Height 08/20/19 1116 5\' 6"  (1.676 m)     Head Circumference --      Peak Flow --      Pain Score 08/20/19 1116 10     Pain Loc --      Pain Edu? --      Excl. in [place]? --    Constitutional:  Alert and oriented. Well appearing and in no distress.  Mildly anxious Eyes: Conjunctivae are normal. Normal extraocular movements. Cardiovascular: Normal rate, regular rhythm. No murmurs, rubs, or gallops. Respiratory: Normal respiratory effort without tachypnea nor retractions.  Slight wheezing in the lung bases bilaterally Gastrointestinal: Soft and nontender. Normal bowel sounds Musculoskeletal: Nontender with normal range of motion in extremities. No lower extremity tenderness nor edema. Neurologic:  Normal speech and language. No  gross focal neurologic deficits are appreciated.  Skin:  Skin is warm, dry and intact. No rash noted. Psychiatric: Mood and affect are normal. Speech and behavior are normal.  ____________________________________________  EKG: Interpreted by me.  Sinus rhythm with a rate of 83 bpm, left axis deviation, incomplete right bundle branch block, normal QT  ____________________________________________  ED COURSE:  As part of my medical decision making, I reviewed the following data within the electronic MEDICAL RECORD NUMBER History obtained from family if available, nursing notes, old chart and ekg, as well as notes from prior ED visits. Patient presented for chest tightness and shortness of breath, we will assess with labs and imaging as indicated at this time.   Procedures  Brooke Perez was evaluated in Emergency Department on 08/20/2019 for the symptoms described in the history of present illness. She was evaluated in the context of the global COVID-19 pandemic, which necessitated consideration that the patient might be at risk for infection with the SARS-CoV-2 virus that causes COVID-19. Institutional protocols and algorithms that pertain to the evaluation of patients at risk for COVID-19 are in a state of rapid change based on information released by regulatory bodies including the CDC and federal and state organizations. These policies and algorithms were followed during the patient's care in the ED.  ____________________________________________   LABS (pertinent positives/negatives)  Labs Reviewed  BASIC METABOLIC PANEL - Abnormal; Notable for the following components:      Result Value   Glucose, Bld 115 (*)    All other components within normal limits  SARS CORONAVIRUS 2 (TAT 6-24 HRS)  CBC  BRAIN NATRIURETIC PEPTIDE  FIBRIN DERIVATIVES D-DIMER ([facility] ONLY)    RADIOLOGY Images were viewed by me  Chest x-ray does not reveal any acute  process  ____________________________________________   DIFFERENTIAL DIAGNOSIS   COPD, pneumonia, COVID-19, MI, PE, pneumothorax  FINAL ASSESSMENT AND PLAN  Dyspnea, chest tightness   Plan: The patient had presented for dyspnea and chest tightness. Patient's labs to this point have been unremarkable. Patient's imaging does not reveal any acute process.  Final disposition is pending at this time.   Ulice Dash, MD    Note: This note was generated in part or whole with voice recognition software. Voice recognition is usually quite accurate but there are transcription errors that can and very often do occur. I apologize for any typographical errors that were not detected and corrected.     Emily Filbert, MD 08/20/19 [phone]

## 2019-08-20 NOTE — ED Triage Notes (Signed)
Pt c/o SOB, chest tightness since 530am. Pt is able to speak in complete sentences with no acute distress noted at present.

## 2019-08-20 NOTE — ED Notes (Signed)
Pt ambulatory to toilet with standby assist. 

## 2019-08-20 NOTE — ED Notes (Signed)
Updated son on progress

## 2019-08-20 NOTE — ED Notes (Signed)
Claiborne Billings, Nuclear [facility] tech called per this RN, notified of Negative Covid swab.  States she should be here within the next 30 minutes.

## 2019-08-20 NOTE — Discharge Instructions (Addendum)
Please seek medical attention for any high fevers, chest pain, shortness of breath, change in behavior, persistent vomiting, bloody stool or any other new or concerning symptoms.  

## 2019-08-20 NOTE — ED Notes (Signed)
Pt provided apple juice and graham crackers upon request.

## 2019-08-20 NOTE — ED Notes (Signed)
Pt requesting water , per MD Jimmye Norman pt able to drink at thsi time

## 2019-09-04 DEATH — deceased
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
## 2020-01-19 ENCOUNTER — Emergency Department: Payer: Medicare HMO

## 2020-01-19 ENCOUNTER — Emergency Department
Admission: EM | Admit: 2020-01-19 | Discharge: 2020-01-19 | Disposition: A | Discharge status: Home / Self Care | Payer: Medicare HMO | Attending: Emergency Medicine | Admitting: Emergency Medicine

## 2020-01-19 ENCOUNTER — Encounter: Payer: Self-pay | Admitting: Emergency Medicine

## 2020-01-19 DIAGNOSIS — J449 Chronic obstructive pulmonary disease, unspecified: Secondary | ICD-10-CM | POA: Diagnosis not present

## 2020-01-19 DIAGNOSIS — Y92513 Shop (commercial) as the place of occurrence of the external cause: Secondary | ICD-10-CM | POA: Diagnosis not present

## 2020-01-19 DIAGNOSIS — S0990XA Unspecified injury of head, initial encounter: Secondary | ICD-10-CM | POA: Diagnosis present

## 2020-01-19 DIAGNOSIS — W19XXXA Unspecified fall, initial encounter: Secondary | ICD-10-CM

## 2020-01-19 DIAGNOSIS — W010XXA Fall on same level from slipping, tripping and stumbling without subsequent striking against object, initial encounter: Secondary | ICD-10-CM | POA: Diagnosis not present

## 2020-01-19 DIAGNOSIS — Y999 Unspecified external cause status: Secondary | ICD-10-CM | POA: Insufficient documentation

## 2020-01-19 DIAGNOSIS — Z79899 Other long term (current) drug therapy: Secondary | ICD-10-CM | POA: Insufficient documentation

## 2020-01-19 DIAGNOSIS — S022XXA Fracture of nasal bones, initial encounter for closed fracture: Secondary | ICD-10-CM | POA: Insufficient documentation

## 2020-01-19 DIAGNOSIS — I10 Essential (primary) hypertension: Secondary | ICD-10-CM | POA: Insufficient documentation

## 2020-01-19 DIAGNOSIS — Y9389 Activity, other specified: Secondary | ICD-10-CM | POA: Insufficient documentation

## 2020-01-19 MED ORDER — ALBUTEROL SULFATE (2.5 MG/3ML) 0.083% IN NEBU
2.5000 mg | INHALATION_SOLUTION | Freq: Once | RESPIRATORY_TRACT | Status: AC
  Administered 2020-01-19: 2.5 mg via RESPIRATORY_TRACT
  Filled 2020-01-19: qty 3

## 2020-01-19 NOTE — ED Provider Notes (Signed)
[facility] Emergency Department Provider Note  Time seen: 9:18 AM  I have reviewed the triage vital signs and the nursing notes.   HISTORY  Chief Complaint Fall   HPI Brooke Perez is a 86 y.o. female with COPD, asthma, arthritis, presents to the emergency department after a fall.  According to the patient [place] several bags when she tripped and fell landing on her knees and hitting her head on the ground.  Patient's main complaint is of pain in the bridge of her nose and in her right knee.  Patient denies any blood thinners.  Describes moderate pain along the bridge of her nose and forehead, moderate pain in the right knee worse with movement.  Mild pain in the left hand and mild pain in the right upper chest that all started after the fall. Past Medical History:  Diagnosis Date  . Allergy 2012   ? benzimidazole, IV dye  . Arthritis    since 1997  . Asthma    Per patient.  . Blood in stool   . Breast screening, unspecified   . COPD (chronic obstructive pulmonary disease) (HCC)   . H/O blood clots 2004  . History of cholecystitis 1986  . Hypertension 1997   since 1997  . PE (pulmonary thromboembolism) (HCC)    2004  . Prolapse of intestine   . Special screening for malignant neoplasms, colon     Patient Active Problem List   Diagnosis Date Noted  . COPD with acute exacerbation (HCC) 05/23/2018  . COPD exacerbation (HCC) 05/22/2018  . Chest pain 12/21/2017  . Dyspnea 10/29/2013  . H/O blood clots     Past Surgical History:  Procedure Laterality Date  . ABDOMINAL HYSTERECTOMY  1968  . broken hand Right 1986  . CHOLECYSTECTOMY  1999   h/o cholecystitis in 1986  . COLON SURGERY  2000  . COLONOSCOPY W/ POLYPECTOMY  12/25/2010   one 21mm semisessile polyp was found in the descending colon  . goiter removal  1965  . TONSILLECTOMY  1954  . TUBAL LIGATION  1968    Prior to Admission medications    Medication Sig Start Date End Date Taking? Authorizing Provider  acetaminophen (TYLENOL) 325 MG tablet Take 325-650 mg by mouth every 6 (six) hours as needed for mild pain or headache.    [provider]  albuterol (PROVENTIL) (2.5 MG/3ML) 0.083% nebulizer solution Take 2.5 mg by nebulization every 4 (four) hours as needed for wheezing or shortness of breath.     [provider]  albuterol (VENTOLIN HFA) 108 (90 BASE) MCG/ACT inhaler Inhale 2 puffs into the lungs every 6 (six) hours as needed for wheezing or shortness of breath.    [provider]  amLODipine (NORVASC) 5 MG tablet Take 2.5 mg by mouth daily.     [provider]  calcium carbonate (TUMS - DOSED IN MG ELEMENTAL CALCIUM) 500 MG chewable tablet Chew 2 tablets by mouth daily as needed for indigestion or heartburn.    [provider]  cholecalciferol (VITAMIN D) 1000 units tablet Take 1,000 Units by mouth daily.    [provider]  furosemide (LASIX) 20 MG tablet Take 20 mg by mouth daily as needed. 08/25/18   [provider]  levothyroxine (SYNTHROID, LEVOTHROID) 75 MCG tablet Take 75 mcg by mouth daily before breakfast.    [provider]  omeprazole (PRILOSEC) 20 MG capsule Take 20 mg by  mouth daily as needed. 08/25/18   [provider]  pantoprazole (PROTONIX) 40 MG tablet Take 1 tablet (40 mg total) by mouth daily. Patient not taking: Reported on 05/22/2018 12/22/17 12/22/18  Salary, Evelena Asa, MD    Allergies  Allergen Reactions  . Alprazolam     unknown  . Aspirin     unknown  . Atenolol     unknown  . Atrovent [Ipratropium]     unknown  . Ciprofloxacin     unknown  . Clonidine Derivatives     unknown  . Codeine     unknown  . Contrast Media [Iodinated Diagnostic Agents]     unknown  . Coumadin [Warfarin Sodium]     unknown  . Darvon [Propoxyphene Hcl]     unknown  . Demerol [Meperidine]     unknown  . Flovent [Fluticasone]      unknown  . Guiatuss [Guaifenesin]     unknown  . Iohexol     unknown  . Keflex [Cephalexin]     unknown  . Morphine And Related     unknown  . Nitroglycerin     unknown  . Parafon Forte Dsc [Chlorzoxazone]     unknown  . Plavix [Clopidogrel Bisulfate]     unknown  . Prednisone (Pak) [Prednisone] Other (See Comments)    coughing  . Pyrimethamine     unknown  . Sulfa Antibiotics     unknown  . Zithromax [Azithromycin]     unknown    Family History  Problem Relation Age of Onset  . Emphysema Sister        smoked  . Emphysema Sister        smoked  . Asthma Sister   . Asthma Brother   . Heart disease Mother   . Heart disease Maternal Uncle   . Heart disease Brother     Social History Social History   Tobacco Use  . Smoking status: Never Smoker  . Smokeless tobacco: Never Used  Substance Use Topics  . Alcohol use: No    Comment: never alcoholic  . Drug use: No    Review of Systems Constitutional: Negative for fever. Cardiovascular: Right upper chest wall pain since the fall. Respiratory: Negative for shortness of breath. Gastrointestinal: Negative for abdominal pain Musculoskeletal: Right knee pain, left hand pain Skin: Abrasions to right knee abrasions to left hand third and fourth digits. Neurological: Negative for headache All other ROS negative  ____________________________________________   PHYSICAL EXAM:  VITAL SIGNS: ED Triage Vitals  Enc Vitals Group     BP 01/19/20 0856 (!) 170/111     Pulse Rate 01/19/20 0856 77     Resp 01/19/20 0856 18     Temp 01/19/20 0856 97.7 F (36.5 C)     Temp src --      SpO2 01/19/20 0855 96 %     Weight 01/19/20 0900 135 lb (61.2 kg)     Height 01/19/20 0900 5\' 6"  (1.676 m)     Head Circumference --      Peak Flow --      Pain Score 01/19/20 0859 10     Pain Loc --      Pain Edu? --      Excl. in GC? --    Constitutional: Alert and oriented. Well appearing and in no distress. Eyes: Normal exam ENT       Head: Mild ecchymosis and swelling along the bridge of the nose.  Dried blood  in the right nostril.  No septal hematoma.      Mouth/Throat: Mucous membranes are moist. Cardiovascular: Normal rate, regular rhythm. Respiratory: Normal respiratory effort without tachypnea nor retractions. Breath sounds are clear.  Mild tenderness palpation in the right upper chest. Gastrointestinal: Soft and nontender. No distention.   Musculoskeletal: Small abrasion overlying right knee with mild pain with range of motion.  Small abrasions overlying the third and fourth digits of the left hand with pain with range of motion. Neurologic:  Normal speech and language. No gross focal neurologic deficits Skin:  Skin is warm, small abrasions as noted above on the left hand and right knee. Psychiatric: Mood and affect are normal.   ____________________________________________    EKG  EKG viewed and interpreted by myself shows a normal sinus rhythm at 74 bpm with a slightly widened QRS, normal axis, normal intervals besides QTC prolongation, nonspecific ST changes.  ____________________________________________    RADIOLOGY  Chest x-ray negative. Knee x-ray negative. Hand x-ray negative. CT scan of the head is negative, CT scan of the neck is negative for acute abnormality.  Possible nasal bone fracture.  ____________________________________________   INITIAL IMPRESSION / ASSESSMENT AND PLAN / ED COURSE  Pertinent labs & imaging results that were available during my care of the patient were reviewed by me and considered in my medical decision making (see chart for details).   Patient presents to the emergency department after a mechanical fall [place] flea [place] this [place].  We will obtain CT images of the head face and neck as precaution.  We will also obtain x-rays of the chest right knee and left hand.  Patient agreeable to plan of care.  Overall appears well.  Patient's work-up is overall  reassuring.  Possible nasal bone fracture otherwise negative work-up.  We will discharge patient home with PCP follow-up.  Patient agreeable to plan of care.  Brooke Perez was evaluated in Emergency Department on 01/19/2020 for the symptoms described in the history of present illness. She was evaluated in the context of the global COVID-19 pandemic, which necessitated consideration that the patient might be at risk for infection with the SARS-CoV-2 virus that causes COVID-19. Institutional protocols and algorithms that pertain to the evaluation of patients at risk for COVID-19 are in a state of rapid change based on information released by regulatory bodies including the CDC and federal and state organizations. These policies and algorithms were followed during the patient's care in the ED.  ____________________________________________   FINAL CLINICAL IMPRESSION(S) / ED DIAGNOSES  Fall Nasal bone fracture   Harvest Dark, MD 01/19/20 1047

## 2020-01-19 NOTE — ED Notes (Signed)
Pt verbalized understanding of discharge instructions. NAD at this time. 

## 2020-01-19 NOTE — ED Triage Notes (Signed)
Pt to ED by [facility] after a mechanical fall. Pt states tripped and fell forward hitting her nose and both knees. Pt denies LOC.

## 2020-02-22 ENCOUNTER — Emergency Department: Payer: Medicare HMO

## 2020-02-22 ENCOUNTER — Other Ambulatory Visit: Payer: Self-pay

## 2020-02-22 ENCOUNTER — Emergency Department
Admission: EM | Admit: 2020-02-22 | Discharge: 2020-02-22 | Disposition: A | Discharge status: Home / Self Care | Payer: Medicare HMO | Attending: Student in an Organized Health Care Education/Training Program | Admitting: Student in an Organized Health Care Education/Training Program

## 2020-02-22 DIAGNOSIS — R2241 Localized swelling, mass and lump, right lower limb: Secondary | ICD-10-CM | POA: Insufficient documentation

## 2020-02-22 DIAGNOSIS — M79604 Pain in right leg: Secondary | ICD-10-CM | POA: Insufficient documentation

## 2020-02-22 DIAGNOSIS — Y929 Unspecified place or not applicable: Secondary | ICD-10-CM | POA: Diagnosis not present

## 2020-02-22 DIAGNOSIS — M79671 Pain in right foot: Secondary | ICD-10-CM | POA: Insufficient documentation

## 2020-02-22 DIAGNOSIS — L03115 Cellulitis of right lower limb: Secondary | ICD-10-CM | POA: Diagnosis not present

## 2020-02-22 DIAGNOSIS — W010XXA Fall on same level from slipping, tripping and stumbling without subsequent striking against object, initial encounter: Secondary | ICD-10-CM | POA: Insufficient documentation

## 2020-02-22 DIAGNOSIS — Y999 Unspecified external cause status: Secondary | ICD-10-CM | POA: Diagnosis not present

## 2020-02-22 DIAGNOSIS — Y9389 Activity, other specified: Secondary | ICD-10-CM | POA: Insufficient documentation

## 2020-02-22 DIAGNOSIS — M7989 Other specified soft tissue disorders: Secondary | ICD-10-CM

## 2020-02-22 LAB — URINALYSIS, COMPLETE (UACMP) WITH MICROSCOPIC
Bacteria, UA: NONE SEEN
Bilirubin Urine: NEGATIVE
Glucose, UA: NEGATIVE mg/dL
Ketones, ur: NEGATIVE mg/dL
Leukocytes,Ua: NEGATIVE
Nitrite: NEGATIVE
Protein, ur: NEGATIVE mg/dL
Specific Gravity, Urine: 1.008 (ref 1.005–1.030)
pH: 6 (ref 5.0–8.0)

## 2020-02-22 LAB — BASIC METABOLIC PANEL
Anion gap: 10 (ref 5–15)
BUN: 18 mg/dL (ref 8–23)
CO2: 25 mmol/L (ref 22–32)
Calcium: 9.7 mg/dL (ref 8.9–10.3)
Chloride: 105 mmol/L (ref 98–111)
Creatinine, Ser: 0.89 mg/dL (ref 0.44–1.00)
GFR calc Af Amer: >60 mL/min (ref >60)
GFR calc non Af Amer: 59 mL/min — ABNORMAL LOW (ref >60)
Glucose, Bld: 106 mg/dL — ABNORMAL HIGH (ref 70–99)
Potassium: 4.6 mmol/L (ref 3.5–5.1)
Sodium: 140 mmol/L (ref 135–145)

## 2020-02-22 LAB — CBC
HCT: 37.8 % (ref 36.0–46.0)
Hemoglobin: 12.8 g/dL (ref 12.0–15.0)
MCH: 28.2 pg (ref 26.0–34.0)
MCHC: 33.9 g/dL (ref 30.0–36.0)
MCV: 83.3 fL (ref 80.0–100.0)
Platelets: 153 10*3/uL (ref 150–400)
RBC: 4.54 MIL/uL (ref 3.87–5.11)
RDW: 14.4 % (ref 11.5–15.5)
WBC: 9.5 10*3/uL (ref 4.0–10.5)
nRBC: 0 % (ref 0.0–0.2)

## 2020-02-22 MED ORDER — PROBIOTIC 250 MG PO CAPS: 1.0000 | ORAL_CAPSULE | Freq: Two times a day (BID) | ORAL | 0 refills | Status: AC | PRN

## 2020-02-22 MED ORDER — CLINDAMYCIN HCL 300 MG PO CAPS: 300.0000 mg | ORAL_CAPSULE | Freq: Three times a day (TID) | ORAL | 0 refills | Status: AC

## 2020-02-22 NOTE — ED Triage Notes (Signed)
Right ankle pain and lower leg swelling, redness and tenderness since fall X 3 weeks ago. Pt with hx of DVT. Sent for rule out DVT vs cellulitis vs fracture from [facility].unable to get around at home due to pain, lives alone.

## 2020-02-22 NOTE — ED Provider Notes (Signed)
[facility] Emergency Department Provider Note    First MD Initiated Contact with Patient 02/22/20 1941     (approximate)  I have reviewed the triage vital signs and the nursing notes.   HISTORY  Chief Complaint Leg Swelling    HPI Brooke Perez is a 86 y.o. female below listed past medical history presents the ER for several weeks of progressively worsening right leg swelling and pain.  This occurred after she had a mechanical fall reportedly.  Did not hit her head.  States that she rolled her ankle off a brick.  Has been able to ambulate but has had pain with ambulation since then.    Past Medical History:  Diagnosis Date  . Allergy 2012   ? benzimidazole, IV dye  . Arthritis    since 1997  . Asthma    Per patient.  . Blood in stool   . Breast screening, unspecified   . COPD (chronic obstructive pulmonary disease) ([place])   . H/O blood clots 2004  . History of cholecystitis 1986  . Hypertension 1997   since 1997  . PE (pulmonary thromboembolism) ([place])    2004  . Prolapse of intestine   . Special screening for malignant neoplasms, colon    Family History  Problem Relation Age of Onset  . Emphysema Sister        smoked  . Emphysema Sister        smoked  . Asthma Sister   . Asthma Brother   . Heart disease Mother   . Heart disease Maternal Uncle   . Heart disease Brother    Past Surgical History:  Procedure Laterality Date  . ABDOMINAL HYSTERECTOMY  1968  . broken hand Right 1986  . CHOLECYSTECTOMY  1999   h/o cholecystitis in 1986  . COLON SURGERY  2000  . COLONOSCOPY W/ POLYPECTOMY  12/25/2010   one 45mm semisessile polyp was found in the descending colon  . goiter removal  1965  . TONSILLECTOMY  1954  . TUBAL LIGATION  1968   Patient Active Problem List   Diagnosis Date Noted  . COPD with acute exacerbation ([place]) 05/23/2018  . COPD exacerbation ([place]) 05/22/2018  . Chest pain 12/21/2017  . Dyspnea 10/29/2013  . H/O blood  clots       Prior to Admission medications   Medication Sig Start Date End Date Taking? Authorizing Provider  acetaminophen (TYLENOL) 325 MG tablet Take 325-650 mg by mouth every 6 (six) hours as needed for mild pain or headache.    [provider]  albuterol (PROVENTIL) (2.5 MG/3ML) 0.083% nebulizer solution Take 2.5 mg by nebulization every 4 (four) hours as needed for wheezing or shortness of breath.     [provider]  albuterol (VENTOLIN HFA) 108 (90 BASE) MCG/ACT inhaler Inhale 2 puffs into the lungs every 6 (six) hours as needed for wheezing or shortness of breath.    [provider]  amLODipine (NORVASC) 5 MG tablet Take 2.5 mg by mouth daily.     [provider]  calcium carbonate (TUMS - DOSED IN MG ELEMENTAL CALCIUM) 500 MG chewable tablet Chew 2 tablets by mouth daily as needed for indigestion or heartburn.    [provider]  cholecalciferol (VITAMIN D) 1000 units tablet Take 1,000 Units by mouth daily.    [provider]  clindamycin (CLEOCIN) 300 MG capsule Take 1 capsule (300 mg total) by mouth 3 (three) times daily for 7 days. 02/22/20  02/29/20  Willy Eddy, MD  furosemide (LASIX) 20 MG tablet Take 20 mg by mouth daily as needed. 08/25/18   [provider]  levothyroxine (SYNTHROID, LEVOTHROID) 75 MCG tablet Take 75 mcg by mouth daily before breakfast.    [provider]  omeprazole (PRILOSEC) 20 MG capsule Take 20 mg by mouth daily as needed. 08/25/18   [provider]  pantoprazole (PROTONIX) 40 MG tablet Take 1 tablet (40 mg total) by mouth daily. Patient not taking: Reported on 05/22/2018 12/22/17 12/22/18  Salary, Evelena Asa, MD  Saccharomyces boulardii (PROBIOTIC) 250 MG CAPS Take 1 capsule by mouth 3 times/day as needed-between meals & bedtime. 02/22/20   Willy Eddy, MD    Allergies Alprazolam, Aspirin, Atenolol, Atrovent [ipratropium], Ciprofloxacin, Clonidine derivatives, Codeine,  Contrast media [iodinated diagnostic agents], Coumadin [warfarin sodium], Darvon [propoxyphene hcl], Demerol [meperidine], Flovent [fluticasone], Guiatuss [guaifenesin], Iohexol, Keflex [cephalexin], Morphine and related, Nitroglycerin, Parafon forte dsc [chlorzoxazone], Plavix [clopidogrel bisulfate], Prednisone (pak) [prednisone], Pyrimethamine, Sulfa antibiotics, and Zithromax [azithromycin]    Social History Social History   Tobacco Use  . Smoking status: Never Smoker  . Smokeless tobacco: Never Used  Substance Use Topics  . Alcohol use: No    Comment: never alcoholic  . Drug use: No    Review of Systems Patient denies headaches, rhinorrhea, blurry vision, numbness, shortness of breath, chest pain, edema, cough, abdominal pain, nausea, vomiting, diarrhea, dysuria, fevers, rashes or hallucinations unless otherwise stated above in HPI. ____________________________________________   PHYSICAL EXAM:  VITAL SIGNS: Vitals:   02/22/20 1709  BP: (!) 159/74  Pulse: 71  Resp: 16  Temp: 98.4 F (36.9 C)  SpO2: 98%    Constitutional: Alert and oriented.  Eyes: Conjunctivae are normal.  Head: Atraumatic. Nose: No congestion/rhinnorhea. Mouth/Throat: Mucous membranes are moist.   Neck: No stridor. Painless ROM.  Cardiovascular: Normal rate, regular rhythm. Grossly normal heart sounds.  Good peripheral circulation. Respiratory: Normal respiratory effort.  No retractions. Lungs CTAB. Gastrointestinal: Soft and nontender. No distention. No abdominal bruits. No CVA tenderness. Genitourinary: deferred Musculoskeletal: Swelling with edema to the right lower extremity it is pitting with overlying warmth no crepitus.  Brisk cap refill.  No ulcerations.  DP and PT pulses palpable.   No joint effusions. Neurologic:  Normal speech and language. No gross focal neurologic deficits are appreciated. No facial droop Skin:  Skin is warm, dry and intact. No rash noted. Psychiatric: Mood and affect  are normal. Speech and behavior are normal.  ____________________________________________   LABS (all labs ordered are listed, but only abnormal results are displayed)  Results for orders placed or performed during the hospital encounter of 02/22/20 (from the past 24 hour(s))  CBC     Status: None   Collection Time: 02/22/20  5:12 PM  Result Value Ref Range   WBC 9.5 4.0 - 10.5 K/uL   RBC 4.54 3.87 - 5.11 MIL/uL   Hemoglobin 12.8 12.0 - 15.0 g/dL   HCT 63.8 75.6 - 43.3 %   MCV 83.3 80.0 - 100.0 fL   MCH 28.2 26.0 - 34.0 pg   MCHC 33.9 30.0 - 36.0 g/dL   RDW 29.5 18.8 - 41.6 %   Platelets 153 150 - 400 K/uL   nRBC 0.0 0.0 - 0.2 %  Basic metabolic panel     Status: Abnormal   Collection Time: 02/22/20  5:12 PM  Result Value Ref Range   Sodium 140 135 - 145 mmol/L   Potassium 4.6 3.5 - 5.1 mmol/L  Chloride 105 98 - 111 mmol/L   CO2 25 22 - 32 mmol/L   Glucose, Bld 106 (H) 70 - 99 mg/dL   BUN 18 8 - 23 mg/dL   Creatinine, Ser 6.30 0.44 - 1.00 mg/dL   Calcium 9.7 8.9 - 16.0 mg/dL   GFR calc non Af Amer 59 (L) >60 mL/min   GFR calc Af Amer >60 >60 mL/min   Anion gap 10 5 - 15  Urinalysis, Complete w Microscopic     Status: Abnormal   Collection Time: 02/22/20  8:28 PM  Result Value Ref Range   Color, Urine STRAW (A) YELLOW   APPearance CLEAR (A) CLEAR   Specific Gravity, Urine 1.008 1.005 - 1.030   pH 6.0 5.0 - 8.0   Glucose, UA NEGATIVE NEGATIVE mg/dL   Hgb urine dipstick MODERATE (A) NEGATIVE   Bilirubin Urine NEGATIVE NEGATIVE   Ketones, ur NEGATIVE NEGATIVE mg/dL   Protein, ur NEGATIVE NEGATIVE mg/dL   Nitrite NEGATIVE NEGATIVE   Leukocytes,Ua NEGATIVE NEGATIVE   RBC / HPF 0-5 0 - 5 RBC/hpf   WBC, UA 0-5 0 - 5 WBC/hpf   Bacteria, UA NONE SEEN NONE SEEN   Squamous Epithelial / LPF 0-5 0 - 5   ____________________________________________  _________________________  RADIOLOGY  I personally reviewed all radiographic images ordered to evaluate for the above acute  complaints and reviewed radiology reports and findings.  These findings were personally discussed with the patient.  Please see medical record for radiology report.  ____________________________________________   PROCEDURES  Procedure(s) performed:  Procedures    Critical Care performed: no ____________________________________________   INITIAL IMPRESSION / ASSESSMENT AND PLAN / ED COURSE  Pertinent labs & imaging results that were available during my care of the patient were reviewed by me and considered in my medical decision making (see chart for details).   DDX: Cellulitis, DVT, limb ischemia, lymphedema, arthritis, fracture  GEORGIA DELSIGNORE is a 86 y.o. who presents to the ED with symptoms as described above.  Patient nontoxic-appearing.  Exam as above.  Imaging ordered to evaluate.  Blood work is reassuring.  Not consistent with septic arthritis.  No effusion.  Good peripheral perfusion.  Clinical Course as of Feb 21 2146  Fri Feb 22, 2020  2142 Imaging is reassuring.  No evidence of UTI.  Will cover patient with antibiotics for mild cellulitis of the right leg.  No crepitus.  Not consistent with deep tissue infection.  Has good peripheral perfusion and cap refill.  Have discussed with the patient and available family all diagnostics and treatments performed thus far and all questions were answered to the best of my ability. The patient demonstrates understanding and agreement with plan.    [PR]    Clinical Course User Index [PR] Willy Eddy, MD    The patient was evaluated in Emergency Department today for the symptoms described in the history of present illness. He/she was evaluated in the context of the global COVID-19 pandemic, which necessitated consideration that the patient might be at risk for infection with the SARS-CoV-2 virus that causes COVID-19. Institutional protocols and algorithms that pertain to the evaluation of patients at risk for COVID-19 are in a  state of rapid change based on information released by regulatory bodies including the CDC and federal and state organizations. These policies and algorithms were followed during the patient's care in the ED.  As part of my medical decision making, I reviewed the following data within the electronic MEDICAL RECORD NUMBER  Nursing notes reviewed and incorporated, Labs reviewed, notes from prior ED visits and [place] Controlled Substance Database   ____________________________________________   FINAL CLINICAL IMPRESSION(S) / ED DIAGNOSES  Final diagnoses:  Right leg swelling  Cellulitis of right lower extremity      NEW MEDICATIONS STARTED DURING THIS VISIT:  New Prescriptions   CLINDAMYCIN (CLEOCIN) 300 MG CAPSULE    Take 1 capsule (300 mg total) by mouth 3 (three) times daily for 7 days.   SACCHAROMYCES BOULARDII (PROBIOTIC) 250 MG CAPS    Take 1 capsule by mouth 3 times/day as needed-between meals & bedtime.     Note:  This document was prepared using Dragon voice recognition software and may include unintentional dictation errors.    Willy Eddy, MD 02/22/20 2148

## 2020-02-22 NOTE — ED Notes (Signed)
Pt assisted to BR by this RN and hat placed into toilet for sample.

## 2020-05-12 ENCOUNTER — Other Ambulatory Visit: Payer: Self-pay

## 2020-05-12 ENCOUNTER — Emergency Department
Admission: EM | Admit: 2020-05-12 | Discharge: 2020-05-12 | Disposition: A | Discharge status: Home / Self Care | Payer: Medicare HMO | Attending: Emergency Medicine | Admitting: Emergency Medicine

## 2020-05-12 ENCOUNTER — Encounter: Payer: Self-pay | Admitting: Emergency Medicine

## 2020-05-12 ENCOUNTER — Emergency Department: Payer: Medicare HMO

## 2020-05-12 DIAGNOSIS — Z20822 Contact with and (suspected) exposure to covid-19: Secondary | ICD-10-CM | POA: Insufficient documentation

## 2020-05-12 DIAGNOSIS — Z79899 Other long term (current) drug therapy: Secondary | ICD-10-CM | POA: Insufficient documentation

## 2020-05-12 DIAGNOSIS — I10 Essential (primary) hypertension: Secondary | ICD-10-CM | POA: Diagnosis not present

## 2020-05-12 DIAGNOSIS — Z86711 Personal history of pulmonary embolism: Secondary | ICD-10-CM | POA: Insufficient documentation

## 2020-05-12 DIAGNOSIS — J441 Chronic obstructive pulmonary disease with (acute) exacerbation: Secondary | ICD-10-CM | POA: Insufficient documentation

## 2020-05-12 DIAGNOSIS — Z79890 Hormone replacement therapy (postmenopausal): Secondary | ICD-10-CM | POA: Insufficient documentation

## 2020-05-12 DIAGNOSIS — R609 Edema, unspecified: Secondary | ICD-10-CM

## 2020-05-12 DIAGNOSIS — L03115 Cellulitis of right lower limb: Secondary | ICD-10-CM | POA: Diagnosis not present

## 2020-05-12 DIAGNOSIS — M7989 Other specified soft tissue disorders: Secondary | ICD-10-CM

## 2020-05-12 DIAGNOSIS — R2241 Localized swelling, mass and lump, right lower limb: Secondary | ICD-10-CM | POA: Diagnosis present

## 2020-05-12 LAB — CBC WITH DIFFERENTIAL/PLATELET
Abs Immature Granulocytes: 0.06 10*3/uL (ref 0.00–0.07)
Basophils Absolute: 0.1 10*3/uL (ref 0.0–0.1)
Basophils Relative: 1 %
Eosinophils Absolute: 0.2 10*3/uL (ref 0.0–0.5)
Eosinophils Relative: 2 %
HCT: 38 % (ref 36.0–46.0)
Hemoglobin: 12.2 g/dL (ref 12.0–15.0)
Immature Granulocytes: 1 %
Lymphocytes Relative: 21 %
Lymphs Abs: 2 10*3/uL (ref 0.7–4.0)
MCH: 27.8 pg (ref 26.0–34.0)
MCHC: 32.1 g/dL (ref 30.0–36.0)
MCV: 86.6 fL (ref 80.0–100.0)
Monocytes Absolute: 0.7 10*3/uL (ref 0.1–1.0)
Monocytes Relative: 7 %
Neutro Abs: 6.8 10*3/uL (ref 1.7–7.7)
Neutrophils Relative %: 68 %
Platelets: 238 10*3/uL (ref 150–400)
RBC: 4.39 MIL/uL (ref 3.87–5.11)
RDW: 14.4 % (ref 11.5–15.5)
WBC: 9.8 10*3/uL (ref 4.0–10.5)
nRBC: 0 % (ref 0.0–0.2)

## 2020-05-12 LAB — URINALYSIS, COMPLETE (UACMP) WITH MICROSCOPIC
Bacteria, UA: NONE SEEN
Bilirubin Urine: NEGATIVE
Glucose, UA: NEGATIVE mg/dL
Ketones, ur: NEGATIVE mg/dL
Nitrite: NEGATIVE
Protein, ur: NEGATIVE mg/dL
Specific Gravity, Urine: 1.003 — ABNORMAL LOW (ref 1.005–1.030)
pH: 7 (ref 5.0–8.0)

## 2020-05-12 LAB — COMPREHENSIVE METABOLIC PANEL
ALT: 13 U/L (ref 0–44)
AST: 20 U/L (ref 15–41)
Albumin: 4.5 g/dL (ref 3.5–5.0)
Alkaline Phosphatase: 70 U/L (ref 38–126)
Anion gap: 9 (ref 5–15)
BUN: 15 mg/dL (ref 8–23)
CO2: 28 mmol/L (ref 22–32)
Calcium: 9.5 mg/dL (ref 8.9–10.3)
Chloride: 104 mmol/L (ref 98–111)
Creatinine, Ser: 0.78 mg/dL (ref 0.44–1.00)
GFR calc Af Amer: >60 mL/min (ref >60)
GFR calc non Af Amer: >60 mL/min (ref >60)
Glucose, Bld: 109 mg/dL — ABNORMAL HIGH (ref 70–99)
Potassium: 3.9 mmol/L (ref 3.5–5.1)
Sodium: 141 mmol/L (ref 135–145)
Total Bilirubin: 0.5 mg/dL (ref 0.3–1.2)
Total Protein: 8 g/dL (ref 6.5–8.1)

## 2020-05-12 LAB — SARS CORONAVIRUS 2 BY RT PCR (HOSPITAL ORDER, PERFORMED IN ~~LOC~~ HOSPITAL LAB): SARS Coronavirus 2: NEGATIVE

## 2020-05-12 LAB — LACTIC ACID, PLASMA: Lactic Acid, Venous: 1.4 mmol/L (ref 0.5–1.9)

## 2020-05-12 MED ORDER — DOXYCYCLINE MONOHYDRATE 100 MG PO TABS: 100.0000 mg | ORAL_TABLET | Freq: Two times a day (BID) | ORAL | 0 refills | Status: AC

## 2020-05-12 NOTE — ED Notes (Signed)
Pt states that she's herre for her right leg where she is healing from previous injury but states that she's pain in the top part of her calf now. Patient also has cough and shob which is 'baseline' for patient.

## 2020-05-12 NOTE — ED Provider Notes (Signed)
[facility] Provider Note  ____________________________________________   First MD Initiated Contact with Patient 05/12/20 1244     (approximate)  I have reviewed the triage vital signs and the nursing notes.   HISTORY  Chief Complaint Leg Swelling   HPI Brooke Perez is a 86 y.o. female with a past medical history of asthma, HTN, COPD, thyroiditis, and remote PE in 2004 not currently on any anticoagulation who presents for assessment of approximately 2 to 3 months of persistent pain redness and swelling in her right lower extremity from her mid calf to her ankle.  Patient states the swelling gets better overnight but returned throughout the day.  She has some soreness at the ankle.  No personal episodes.  No clear alleviating aggravating factors.  Patient endorses chronic cough and congestion that have been going on for "years" without any significant change over the last several weeks.  She denies any headache, earache, sore throat, fevers, chest pain, back pain, abdominal pain, nausea, vomiting, diarrhea, dysuria, blood in her stool, blood in her urine, any other areas of redness or rash or any other areas of pain.  She denies any falls or recent traumatic injuries.  No other acute complaints at this time.         Past Medical History:  Diagnosis Date  . Allergy 2012   ? benzimidazole, IV dye  . Arthritis    since 1997  . Asthma    Per patient.  . Blood in stool   . Breast screening, unspecified   . COPD (chronic obstructive pulmonary disease) (HCC)   . H/O blood clots 2004  . History of cholecystitis 1986  . Hypertension 1997   since 1997  . PE (pulmonary thromboembolism) (HCC)    2004  . Prolapse of intestine   . Special screening for malignant neoplasms, colon     Patient Active Problem List   Diagnosis Date Noted  . COPD with acute exacerbation (HCC) 05/23/2018  . COPD exacerbation (HCC) 05/22/2018  . Chest pain  12/21/2017  . Dyspnea 10/29/2013  . H/O blood clots     Past Surgical History:  Procedure Laterality Date  . ABDOMINAL HYSTERECTOMY  1968  . broken hand Right 1986  . CHOLECYSTECTOMY  1999   h/o cholecystitis in 1986  . COLON SURGERY  2000  . COLONOSCOPY W/ POLYPECTOMY  12/25/2010   one 37mm semisessile polyp was found in the descending colon  . goiter removal  1965  . TONSILLECTOMY  1954  . TUBAL LIGATION  1968    Prior to Admission medications   Medication Sig Start Date End Date Taking? Authorizing Provider  acetaminophen (TYLENOL) 325 MG tablet Take 325-650 mg by mouth every 6 (six) hours as needed for mild pain or headache.    [provider]  albuterol (PROVENTIL) (2.5 MG/3ML) 0.083% nebulizer solution Take 2.5 mg by nebulization every 4 (four) hours as needed for wheezing or shortness of breath.     [provider]  albuterol (VENTOLIN HFA) 108 (90 BASE) MCG/ACT inhaler Inhale 2 puffs into the lungs every 6 (six) hours as needed for wheezing or shortness of breath.    [provider]  amLODipine (NORVASC) 5 MG tablet Take 2.5 mg by mouth daily.     [provider]  calcium carbonate (TUMS - DOSED IN MG ELEMENTAL CALCIUM) 500 MG chewable tablet Chew 2 tablets by mouth daily as needed for indigestion or heartburn.    [provider]  cholecalciferol (VITAMIN D) 1000 units tablet Take 1,000 Units by mouth daily.    [provider]  doxycycline (ADOXA) 100 MG tablet Take 1 tablet (100 mg total) by mouth 2 (two) times daily for 7 days. 05/12/20 05/19/20  Gilles Chiquito, MD  furosemide (LASIX) 20 MG tablet Take 20 mg by mouth daily as needed. 08/25/18   [provider]  levothyroxine (SYNTHROID, LEVOTHROID) 75 MCG tablet Take 75 mcg by mouth daily before breakfast.    [provider]  omeprazole (PRILOSEC) 20 MG capsule Take 20 mg by mouth daily as needed. 08/25/18   [provider]  Saccharomyces boulardii  (PROBIOTIC) 250 MG CAPS Take 1 capsule by mouth 3 times/day as needed-between meals & bedtime. 02/22/20   Willy Eddy, MD    Allergies Alprazolam, Aspirin, Atenolol, Atrovent [ipratropium], Ciprofloxacin, Clonidine derivatives, Codeine, Contrast media [iodinated diagnostic agents], Coumadin [warfarin sodium], Darvon [propoxyphene hcl], Demerol [meperidine], Flovent [fluticasone], Guiatuss [guaifenesin], Iohexol, Keflex [cephalexin], Morphine and related, Nitroglycerin, Parafon forte dsc [chlorzoxazone], Plavix [clopidogrel bisulfate], Prednisone (pak) [prednisone], Pyrimethamine, Sulfa antibiotics, and Zithromax [azithromycin]  Family History  Problem Relation Age of Onset  . Emphysema Sister        smoked  . Emphysema Sister        smoked  . Asthma Sister   . Asthma Brother   . Heart disease Mother   . Heart disease Maternal Uncle   . Heart disease Brother     Social History Social History   Tobacco Use  . Smoking status: Never Smoker  . Smokeless tobacco: Never Used  Substance Use Topics  . Alcohol use: No    Comment: never alcoholic  . Drug use: No    Review of Systems  Review of Systems  Constitutional: Positive for chills. Negative for fever.  HENT: Positive for congestion ( chronic). Negative for sore throat.   Eyes: Negative for pain.  Respiratory: Positive for cough ( chronic). Negative for stridor.   Cardiovascular: Negative for chest pain.  Gastrointestinal: Negative for vomiting.  Musculoskeletal: Positive for joint pain ( R ankle) and myalgias ( R lower leg).  Skin: Negative for rash.  Neurological: Negative for seizures, loss of consciousness and headaches.  Psychiatric/Behavioral: Negative for suicidal ideas.  All other systems reviewed and are negative.     ____________________________________________   PHYSICAL EXAM:  VITAL SIGNS: ED Triage Vitals  Enc Vitals Group     BP 05/12/20 1125 (!) 143/92     Pulse Rate 05/12/20 1125 73     Resp  05/12/20 1125 18     Temp 05/12/20 1125 98.3 F (36.8 C)     Temp Source 05/12/20 1125 Oral     SpO2 05/12/20 1125 97 %     Weight 05/12/20 1126 135 lb (61.2 kg)     Height 05/12/20 1126 5\' 6"  (1.676 m)     Head Circumference --      Peak Flow --      Pain Score 05/12/20 1125 0     Pain Loc --      Pain Edu? --      Excl. in GC? --    Vitals:   05/12/20 1125  BP: (!) 143/92  Pulse: 73  Resp: 18  Temp: 98.3 F (36.8 C)  SpO2: 97%   Physical Exam Vitals and nursing note reviewed.  Constitutional:      General: She is not in acute distress.    Appearance: She is well-developed.  HENT:  Head: Normocephalic and atraumatic.     Right Ear: External ear normal.     Left Ear: External ear normal.     Nose: Nose normal.     Mouth/Throat:     Mouth: Mucous membranes are moist.  Eyes:     Conjunctiva/sclera: Conjunctivae normal.  Cardiovascular:     Rate and Rhythm: Normal rate and regular rhythm.     Heart sounds: No murmur heard.   Pulmonary:     Effort: Pulmonary effort is normal. No respiratory distress.     Breath sounds: Normal breath sounds.  Abdominal:     Palpations: Abdomen is soft.     Tenderness: There is no abdominal tenderness.  Musculoskeletal:     Cervical back: Neck supple.     Right lower leg: Edema present.  Skin:    General: Skin is warm and dry.     Capillary Refill: Capillary refill takes less than 2 seconds.  Neurological:     Mental Status: She is alert and oriented to person, place, and time.  Psychiatric:        Mood and Affect: Mood normal.     She has erythema, edema, warmth, and mild tenderness from the mid calf circumferentially down to the ankle.  She does have full passive and active range of motion at the right ankle and a 2+ DP pulse.  Sensation is intact throughout the right lower extremity.  No other overlying skin changes or involvement of the knee joint or hip.  No fluctuance, crepitus, or other skin  changes.  ____________________________________________   LABS (all labs ordered are listed, but only abnormal results are displayed)  Labs Reviewed  COMPREHENSIVE METABOLIC PANEL - Abnormal; Notable for the following components:      Result Value   Glucose, Bld 109 (*)    All other components within normal limits  SARS CORONAVIRUS 2 BY RT PCR (HOSPITAL ORDER, PERFORMED IN [facility])  LACTIC ACID, PLASMA  CBC WITH DIFFERENTIAL/PLATELET  LACTIC ACID, PLASMA  URINALYSIS, COMPLETE (UACMP) WITH MICROSCOPIC   ____________________________________________  EKG  Sinus rhythm with a ventricular rate of 64, left axis deviation, left ventricular hypertrophy, unremarkable intervals, no evidence of acute ischemia or other significant arrhythmia. ____________________________________________  RADIOLOGY   Official radiology report(s): US Venous Img Lower Unilateral Right (DVT)  Result Date: 05/12/2020 CLINICAL DATA:  86 year old female with a history of swelling EXAM: RIGHT LOWER EXTREMITY VENOUS DOPPLER ULTRASOUND TECHNIQUE: Gray-scale sonography with graded compression, as well as color Doppler and duplex ultrasound were performed to evaluate the lower extremity deep venous systems from the level of the common femoral vein and including the common femoral, femoral, profunda femoral, popliteal and calf veins including the posterior tibial, peroneal and gastrocnemius veins when visible. The superficial great saphenous vein was also interrogated. Spectral Doppler was utilized to evaluate flow at rest and with distal augmentation maneuvers in the common femoral, femoral and popliteal veins. COMPARISON:  None. FINDINGS: Contralateral Common Femoral Vein: Respiratory phasicity is normal and symmetric with the symptomatic side. No evidence of thrombus. Normal compressibility. Common Femoral Vein: No evidence of thrombus. Normal compressibility, respiratory phasicity and response to  augmentation. Saphenofemoral Junction: No evidence of thrombus. Normal compressibility and flow on color Doppler imaging. Profunda Femoral Vein: No evidence of thrombus. Normal compressibility and flow on color Doppler imaging. Femoral Vein: No evidence of thrombus. Normal compressibility, respiratory phasicity and response to augmentation. Popliteal Vein: No evidence of thrombus. Normal compressibility, respiratory phasicity and response to augmentation.  Calf Veins: No evidence of thrombus. Normal compressibility and flow on color Doppler imaging. Superficial Great Saphenous Vein: No evidence of thrombus. Normal compressibility and flow on color Doppler imaging. Other Findings:  None. IMPRESSION: Sonographic survey of the right lower extremity negative for DVT Electronically Signed   By: Gilmer MorJaime  Wagner D.O.   On: 05/12/2020 12:57    ____________________________________________   PROCEDURES  Procedure(s) performed (including Critical Care):  Procedures   ____________________________________________   INITIAL IMPRESSION / ASSESSMENT AND PLAN / ED COURSE        Overall patient's history, exam, and ED work-up is most consistent with cellulitis of the right lower extremity.  No evidence on ultrasound of DVT.  No evidence on history or x-ray of fracture or dislocation.  Exam is not consistent with any neurovascular deficit, thrombophlebitis, necrotizing fasciitis, or other acute infectious or traumatic process.  No other significant electrolyte or metabolic derangements identified on above labs.  Patient discharged stable condition.  Strict return precautions advised and discussed.  Rx written for doxycycline with instructions to follow-up with PCP in 5 to 7 days for wound check.  Given patient does endorse chronic cough and congestion Covid test was also obtained in the ED today and she was instructed on appropriate precautions pending results of this test.        ____________________________________________   FINAL CLINICAL IMPRESSION(S) / ED DIAGNOSES  Final diagnoses:  Right leg swelling  Cellulitis of right lower extremity     ED Discharge Orders         Ordered    doxycycline (ADOXA) 100 MG tablet  2 times daily     Discontinue  Reprint     05/12/20 1353           Note:  This document was prepared using Dragon voice recognition software and may include unintentional dictation errors.   Gilles ChiquitoSmith, Nakhi Choi P, MD 05/12/20 [phone]

## 2020-05-12 NOTE — ED Triage Notes (Signed)
Pt from [facility] with swelling to her right leg that started 3 months ago. Pt reports that it swells when she begins to move around in the morning. Pt also has redness to her right lower leg. Pt alert & oriented, nad noted.

## 2020-06-13 ENCOUNTER — Encounter (INDEPENDENT_AMBULATORY_CARE_PROVIDER_SITE_OTHER): Payer: Medicare HMO | Admitting: Vascular Surgery

## 2020-07-08 ENCOUNTER — Encounter (INDEPENDENT_AMBULATORY_CARE_PROVIDER_SITE_OTHER): Payer: Medicare HMO | Admitting: Vascular Surgery

## 2020-07-18 ENCOUNTER — Encounter (INDEPENDENT_AMBULATORY_CARE_PROVIDER_SITE_OTHER): Payer: Medicare HMO | Admitting: Vascular Surgery

## 2020-08-12 ENCOUNTER — Other Ambulatory Visit: Payer: Self-pay

## 2020-08-12 ENCOUNTER — Ambulatory Visit (INDEPENDENT_AMBULATORY_CARE_PROVIDER_SITE_OTHER): Payer: Medicare HMO | Admitting: Vascular Surgery

## 2020-08-12 ENCOUNTER — Encounter (INDEPENDENT_AMBULATORY_CARE_PROVIDER_SITE_OTHER): Payer: Self-pay | Admitting: Vascular Surgery

## 2020-08-12 VITALS — BP 157/75 | HR 73 | Resp 17 | Ht 66.0 in | Wt 138.0 lb

## 2020-08-12 DIAGNOSIS — R600 Localized edema: Secondary | ICD-10-CM

## 2020-08-12 DIAGNOSIS — I890 Lymphedema, not elsewhere classified: Secondary | ICD-10-CM | POA: Insufficient documentation

## 2020-08-12 DIAGNOSIS — M7989 Other specified soft tissue disorders: Secondary | ICD-10-CM | POA: Insufficient documentation

## 2020-08-12 DIAGNOSIS — I10 Essential (primary) hypertension: Secondary | ICD-10-CM

## 2020-08-12 NOTE — Assessment & Plan Note (Signed)
Swelling work-up as described above.  The patient has previous trauma, cellulitis, and is clearly developed lymphedema from chronic scarring and lymphatic channels.  Once we get her ultrasound, a lymphedema pump would likely be an excellent adjuvant therapy.

## 2020-08-12 NOTE — Progress Notes (Signed)
Patient ID: Brooke Perez, female   DOB: [birth year], 86 y.o.   MRN: [id number]  Chief Complaint  Patient presents with   New Patient (Initial Visit)    right leg swelling    HPI Brooke Perez is a 86 y.o. female.  I am asked to see the patient by Dr. Allena Katz for evaluation of right leg swelling.  This started about 5 or 6 months ago when she had a significant fall in the right leg with a wound and developed cellulitis that slowly heal.  She has really had swelling since that time.  She had a negative DVT study, but has not had a functional assessment of her veins.  No real left leg problems.  The leg is very sore and heavy.  There is discoloration in addition to the swelling.  She does have a previous history of DVT almost 2 decades ago.  This included a pulmonary embolus as well as the clot in her leg   Past Medical History:  Diagnosis Date   Allergy 2012   ? benzimidazole, IV dye   Arthritis    since 1997   Asthma    Per patient.   Blood in stool    Breast screening, unspecified    COPD (chronic obstructive pulmonary disease) (HCC)    H/O blood clots 2004   History of cholecystitis 1986   Hypertension 1997   since 1997   PE (pulmonary thromboembolism) (HCC)    2004   Prolapse of intestine    Special screening for malignant neoplasms, colon     Past Surgical History:  Procedure Laterality Date   ABDOMINAL HYSTERECTOMY  1968   broken hand Right 1986   CHOLECYSTECTOMY  1999   h/o cholecystitis in 1986   COLON SURGERY  2000   COLONOSCOPY W/ POLYPECTOMY  12/25/2010   one 20mm semisessile polyp was found in the descending colon   goiter removal  1965   TONSILLECTOMY  1954   TUBAL LIGATION  1968     Family History  Problem Relation Age of Onset   Emphysema Sister        smoked   Emphysema Sister        smoked   Asthma Sister    Asthma Brother    Heart disease Mother    Heart disease Maternal Uncle    Heart disease Brother       Social History   Tobacco Use   Smoking status: Never Smoker   Smokeless tobacco: Never Used  Substance Use Topics   Alcohol use: No    Comment: never alcoholic   Drug use: No     Allergies  Allergen Reactions   Camphor-Eucalyptus-Menthol [Liniments] Other (See Comments)    Critical; however, unknown reaction.  Had some reaction to an ingredient in the cough drop that put her in the hospital. Critical; however, unknown reaction.  Had some reaction to an ingredient in the cough drop that put her in the hospital.    Alprazolam     unknown   Aspirin     unknown   Atenolol     unknown   Atrovent [Ipratropium]     unknown   Ciprofloxacin     unknown   Clonidine Derivatives     unknown   Codeine     unknown   Contrast Media [Iodinated Diagnostic Agents]     unknown   Coumadin [Warfarin Sodium]     unknown   Darvon [Propoxyphene Hcl]  unknown   Demerol [Meperidine]     unknown   Flovent [Fluticasone]     unknown   Guiatuss [Guaifenesin]     unknown   Iohexol     unknown   Keflex [Cephalexin]     unknown   Morphine And Related     unknown   Nitroglycerin     unknown   Parafon Forte Dsc [Chlorzoxazone]     unknown   Plavix [Clopidogrel Bisulfate]     unknown   Prednisone (Pak) [Prednisone] Other (See Comments)    coughing   Pyrimethamine     unknown   Sulfa Antibiotics     unknown   Zithromax [Azithromycin]     unknown    Current Outpatient Medications  Medication Sig Dispense Refill   acetaminophen (TYLENOL) 325 MG tablet Take 325-650 mg by mouth every 6 (six) hours as needed for mild pain or headache.     albuterol (PROVENTIL) (2.5 MG/3ML) 0.083% nebulizer solution Take 2.5 mg by nebulization every 4 (four) hours as needed for wheezing or shortness of breath.      albuterol (VENTOLIN HFA) 108 (90 BASE) MCG/ACT inhaler Inhale 2 puffs into the lungs every 6 (six) hours as needed for wheezing or shortness of breath.      amLODipine (NORVASC) 5 MG tablet Take 2.5 mg by mouth daily.      calcium carbonate (TUMS - DOSED IN MG ELEMENTAL CALCIUM) 500 MG chewable tablet Chew 2 tablets by mouth daily as needed for indigestion or heartburn.     cholecalciferol (VITAMIN D) 1000 units tablet Take 1,000 Units by mouth daily.     furosemide (LASIX) 20 MG tablet Take 20 mg by mouth daily as needed.  4   levothyroxine (SYNTHROID, LEVOTHROID) 75 MCG tablet Take 75 mcg by mouth daily before breakfast.     omeprazole (PRILOSEC) 20 MG capsule Take 20 mg by mouth daily as needed.  2   Saccharomyces boulardii (PROBIOTIC) 250 MG CAPS Take 1 capsule by mouth 3 times/day as needed-between meals & bedtime. 60 capsule 0   diclofenac Sodium (VOLTAREN) 1 % GEL Apply topically.     Multiple Vitamin (ONE DAILY) tablet Take by mouth.     polyethylene glycol powder (GLYCOLAX/MIRALAX) 17 GM/SCOOP powder Take by mouth.     No current facility-administered medications for this visit.      REVIEW OF SYSTEMS (Negative unless checked)  Constitutional: Weight loss  Fever  Chills Cardiac: Chest pain   Chest pressure   Palpitations   Shortness of breath when laying flat   Shortness of breath at rest   Shortness of breath with exertion. Vascular:  Pain in legs with walking   Pain in legs at rest   Pain in legs when laying flat   Claudication   Pain in feet when walking  Pain in feet at rest  Pain in feet when laying flat   History of DVT   Phlebitis   Swelling in legs   Varicose veins   Non-healing ulcers Pulmonary:   Uses home oxygen   Productive cough   Hemoptysis   Wheeze  COPD   Asthma Neurologic:  Dizziness  Blackouts   Seizures   History of stroke   History of TIA  Aphasia   Temporary blindness   Dysphagia   Weakness or numbness in arms   Weakness or numbness in legs Musculoskeletal:  Arthritis   Joint swelling   Joint pain   Low back  pain Hematologic:  Easy  bruising  [] Easy bleeding   [] Hypercoagulable state   [] Anemic  [] Hepatitis Gastrointestinal:  [] Blood in stool   [] Vomiting blood  [] Gastroesophageal reflux/heartburn   [] Abdominal pain Genitourinary:  [] Chronic kidney disease   [] Difficult urination  [] Frequent urination  [] Burning with urination   [] Hematuria Skin:  [] Rashes   [] Ulcers   [] Wounds Psychological:  [] History of anxiety   []  History of major depression.    Physical Exam BP (!) 157/75 (BP Location: Right Arm)    Pulse 73    Resp 17    Ht 5\' 6"  (1.676 m)    Wt 138 lb (62.6 kg)    BMI 22.27 kg/m  Gen:  WD/WN, NAD.  Appears younger than stated age Head: [place]/AT, No temporalis wasting.  Ear/Nose/Throat: Hearing grossly intact, nares w/o erythema or drainage, oropharynx w/o Erythema/Exudate Eyes: Conjunctiva clear, sclera non-icteric  Neck: trachea midline.  No JVD.  Pulmonary:  Good air movement, respirations not labored, no use of accessory muscles  Cardiac: RRR, no JVD Vascular:  Vessel Right Left  Radial Palpable Palpable                          DP  1+  1+  PT  not palpable  1+   Gastrointestinal:. No masses, surgical incisions, or scars. Musculoskeletal: M/S 5/5 throughout.  Extremities without ischemic changes.  No deformity or atrophy.  Moderate stasis dermatitis changes present in the right leg.  2+ right lower extremity edema, trace left lower extremity edema. Neurologic: Sensation grossly intact in extremities.  Symmetrical.  Speech is fluent. Motor exam as listed above. Psychiatric: Judgment intact, Mood & affect appropriate for pt's clinical situation. Dermatologic: No rashes or ulcers noted.  No cellulitis or open wounds.    Radiology No results found.  Labs No results found for this or any previous visit (from the past 2160 hour(s)).  Assessment/Plan:  Swelling of limb I have had a long discussion with the patient regarding swelling and why it  causes symptoms.  Patient  will begin wearing graduated compression stockings class 1 (20-30 mmHg) on a daily basis a prescription was given. The patient will  beginning wearing the stockings first thing in the morning and removing them in the evening. The patient is instructed specifically not to sleep in the stockings.   In addition, behavioral modification will be initiated.  This will include frequent elevation, use of over the counter pain medications and exercise such as walking.  I have reviewed systemic causes for chronic edema such as liver, kidney and cardiac etiologies.  The patient denies problems with these organ systems.    Consideration for a lymph pump will also be made based upon the effectiveness of conservative therapy.  This would help to improve the edema control and prevent sequela such as ulcers and infections   Patient should undergo duplex ultrasound of the venous system to ensure that DVT or reflux is not present.  The patient will follow-up with me after the ultrasound.    Lymphedema Swelling work-up as described above.  The patient has previous trauma, cellulitis, and is clearly developed lymphedema from chronic scarring and lymphatic channels.  Once we get her ultrasound, a lymphedema pump would likely be an excellent adjuvant therapy.  Benign essential HTN blood pressure control important in reducing the progression of atherosclerotic disease. On appropriate oral medications.       08/12/2020, 10:58 AM   This note was created with Dragon medical  transcription system.  Any errors from dictation are unintentional.

## 2020-08-12 NOTE — Assessment & Plan Note (Signed)

## 2020-08-12 NOTE — Patient Instructions (Signed)

## 2020-08-12 NOTE — Assessment & Plan Note (Signed)
blood pressure control important in reducing the progression of atherosclerotic disease. On appropriate oral medications.  

## 2020-09-02 ENCOUNTER — Ambulatory Visit (INDEPENDENT_AMBULATORY_CARE_PROVIDER_SITE_OTHER): Payer: Medicare HMO | Admitting: Nurse Practitioner

## 2020-09-02 ENCOUNTER — Encounter (INDEPENDENT_AMBULATORY_CARE_PROVIDER_SITE_OTHER): Payer: Medicare HMO

## 2020-09-16 ENCOUNTER — Ambulatory Visit (INDEPENDENT_AMBULATORY_CARE_PROVIDER_SITE_OTHER): Payer: Medicare HMO | Admitting: Nurse Practitioner

## 2020-09-16 ENCOUNTER — Ambulatory Visit (INDEPENDENT_AMBULATORY_CARE_PROVIDER_SITE_OTHER): Payer: Medicare HMO

## 2020-09-16 ENCOUNTER — Encounter (INDEPENDENT_AMBULATORY_CARE_PROVIDER_SITE_OTHER): Payer: Self-pay | Admitting: Nurse Practitioner

## 2020-09-16 ENCOUNTER — Other Ambulatory Visit: Payer: Self-pay

## 2020-09-16 VITALS — BP 152/90 | HR 71 | Ht 68.0 in | Wt 138.0 lb

## 2020-09-16 DIAGNOSIS — I10 Essential (primary) hypertension: Secondary | ICD-10-CM

## 2020-09-16 DIAGNOSIS — R600 Localized edema: Secondary | ICD-10-CM | POA: Diagnosis not present

## 2020-09-16 DIAGNOSIS — I890 Lymphedema, not elsewhere classified: Secondary | ICD-10-CM

## 2020-09-16 DIAGNOSIS — M7989 Other specified soft tissue disorders: Secondary | ICD-10-CM

## 2020-09-23 ENCOUNTER — Encounter (INDEPENDENT_AMBULATORY_CARE_PROVIDER_SITE_OTHER): Payer: Self-pay | Admitting: Nurse Practitioner

## 2020-09-23 NOTE — Progress Notes (Signed)
Subjective:    Patient ID: Brooke Perez, female    DOB: 06/06/1933, 86 y.o.   MRN: [id number] Chief Complaint  Patient presents with  . Follow-up    U/S follow up     The patient returns to the office for followup evaluation regarding leg swelling.  The swelling has improved quite a bit and the pain associated with swelling has decreased substantially. There have not been any interval development of a ulcerations or wounds.  Since the previous visit the patient has been wearing graduated compression stockings and has noted significant improvement in the lymphedema. The patient has been using compression routinely morning until night.  The patient also states elevation during the day and exercise is being done too.   Today noninvasive study showed no evidence of DVT or superficial thrombophlebitis bilaterally.  No evidence of deep venous insufficiency or superficial venous reflux seen bilaterally.   Review of Systems  Cardiovascular: Positive for leg swelling.  All other systems reviewed and are negative.      Objective:   Physical Exam Vitals reviewed.  HENT:     Head: Normocephalic.  Cardiovascular:     Rate and Rhythm: Normal rate.     Pulses: Normal pulses.  Pulmonary:     Effort: Pulmonary effort is normal.  Musculoskeletal:     Right lower leg: No edema.     Left lower leg: No edema.  Neurological:     Mental Status: She is alert and oriented to person, place, and time.  Psychiatric:        Mood and Affect: Mood normal.        Behavior: Behavior normal.        Thought Content: Thought content normal.        Judgment: Judgment normal.     BP (!) 152/90   Pulse 71   Ht 5\' 8"  (1.727 m)   Wt 138 lb (62.6 kg)   BMI 20.98 kg/m   Past Medical History:  Diagnosis Date  . Allergy 2012   ? benzimidazole, IV dye  . Arthritis    since 1997  . Asthma    Per patient.  . Blood in stool   . Breast screening, unspecified   . COPD (chronic obstructive  pulmonary disease) (HCC)   . H/O blood clots 2004  . History of cholecystitis 1986  . Hypertension 1997   since 1997  . PE (pulmonary thromboembolism) (HCC)    2004  . Prolapse of intestine   . Special screening for malignant neoplasms, colon     Social History   Socioeconomic History  . Marital status: Married    Spouse name: Not on file  . Number of children: 2  . Years of education: Not on file  . Highest education level: Not on file  Occupational History  . 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 Smoking status: Never Smoker  . Smokeless tobacco: Never Used  Substance and Sexual Activity  . Alcohol use: No    Comment: never alcoholic  . Drug use: No  . Sexual activity: Not on file  Other Topics Concern  . Not on file  Social History Narrative   Lives at home with husband, independent at baseline   Social Determinants of Health   Financial Resource Strain: Not on file  Food Insecurity: Not on file  Transportation Needs: Not on file  Physical Activity:  Not on file  Stress: Not on file  Social Connections: Not on file  Intimate Partner Violence: Not on file    Past Surgical History:  Procedure Laterality Date  . ABDOMINAL HYSTERECTOMY  1968  . broken hand Right 1986  . CHOLECYSTECTOMY  1999   h/o cholecystitis in 1986  . COLON SURGERY  2000  . COLONOSCOPY W/ POLYPECTOMY  12/25/2010   one 42mm semisessile polyp was found in the descending colon  . goiter removal  1965  . TONSILLECTOMY  1954  . TUBAL LIGATION  1968    Family History  Problem Relation Age of Onset  . Emphysema Sister        smoked  . Emphysema Sister        smoked  . Asthma Sister   . Asthma Brother   . Heart disease Mother   . Heart disease Maternal Uncle   . Heart disease Brother     Allergies  Allergen Reactions  . Camphor-Eucalyptus-Menthol [Liniments] Other (See Comments)    Critical; however, unknown reaction.  Had  some reaction to an ingredient in the cough drop that put her in the hospital. Critical; however, unknown reaction.  Had some reaction to an ingredient in the cough drop that put her in the hospital.   . Dextromethorphan Hbr Rash    Critical; however, unknown reaction.  . Alprazolam     unknown  . Amlodipine Swelling  . Aspirin     unknown  . Atenolol     unknown  . Atrovent [Ipratropium]     unknown  . Ciprofloxacin     unknown  . Clonidine Derivatives     unknown  . Codeine     unknown  . Contrast Media [Iodinated Diagnostic Agents]     unknown  . Coumadin [Warfarin Sodium]     unknown  . Darvon [Propoxyphene Hcl]     unknown  . Demerol [Meperidine]     unknown  . Fish Allergy Nausea Only    Other reaction(s): NAUSEA Other reaction(s): NAUSEA   . Flovent [Fluticasone]     unknown  . Guiatuss [Guaifenesin]     unknown  . Hydrocodone-Homatropine Other (See Comments)  . Iohexol     unknown  . Keflex [Cephalexin]     unknown  . Morphine And Related     unknown  . Nitroglycerin     unknown  . Other Other (See Comments)    OTC Hall[place]  . Parafon Forte Dsc [Chlorzoxazone]     unknown  . Phenazopyridine Other (See Comments)    Other reaction(s): UNKNOWN Other reaction(s): UNKNOWN   . Plavix [Clopidogrel Bisulfate]     unknown  . Prednisone (Pak) [Prednisone] Other (See Comments)    coughing  . Pyrimethamine     unknown  . Sertraline Other (See Comments)  . Sulfa Antibiotics     unknown  . Sulfasalazine Other (See Comments)  . Zithromax [Azithromycin]     unknown    CBC Latest Ref Rng & Units 05/12/2020 02/22/2020 08/20/2019  WBC 4.0 - 10.5 K/uL 9.8 9.5 8.3  Hemoglobin 12.0 - 15.0 g/dL 88.5 02.7 74.1  Hematocrit 36.0 - 46.0 % 38.0 37.8 38.6  Platelets 150 - 400 K/uL 238 153 218      CMP     Component Value Date/Time   NA 141 05/12/2020 1141   NA 138 01/11/2014 1127   K 3.9 05/12/2020 1141   K 3.5 01/11/2014 1127   CL 104 05/12/2020 1141  CL 106  01/11/2014 1127   CO2 28 05/12/2020 1141   CO2 28 01/11/2014 1127   GLUCOSE 109 (H) 05/12/2020 1141   GLUCOSE 115 (H) 01/11/2014 1127   BUN 15 05/12/2020 1141   BUN 13 01/11/2014 1127   CREATININE 0.78 05/12/2020 1141   CREATININE 0.83 01/11/2014 1127   CALCIUM 9.5 05/12/2020 1141   CALCIUM 9.4 01/11/2014 1127   PROT 8.0 05/12/2020 1141   PROT 7.7 01/11/2014 1127   ALBUMIN 4.5 05/12/2020 1141   ALBUMIN 4.2 01/11/2014 1127   AST 20 05/12/2020 1141   AST 25 01/11/2014 1127   ALT 13 05/12/2020 1141   ALT 19 01/11/2014 1127   ALKPHOS 70 05/12/2020 1141   ALKPHOS 83 01/11/2014 1127   BILITOT 0.5 05/12/2020 1141   BILITOT 0.4 01/11/2014 1127   GFRNONAA >60 05/12/2020 1141   GFRNONAA >60 01/11/2014 1127   GFRAA >60 05/12/2020 1141   GFRAA >60 01/11/2014 1127     No results found.     Assessment & Plan:   1. Lymphedema No surgery or intervention at this point in time.  I have reviewed my discussion with the patient regarding venous insufficiency and why it causes symptoms. I have discussed with the patient the chronic skin changes that accompany venous insufficiency and the long term sequela such as ulceration. Patient will contnue wearing graduated compression stockings on a daily basis, as this has provided excellent control of his edema. The patient will put the stockings on first thing in the morning and removing them in the evening. The patient is reminded not to sleep in the stockings.  In addition, behavioral modification including elevation during the day will be initiated. Exercise is strongly encouraged.   Given the patient's good control and lack of any problems regarding the venous insufficiency and lymphedema a lymph pump in not need at this time.  The patient will follow up win 6 months.  2. Benign essential HTN Continue antihypertensive medications as already ordered, these medications have been reviewed and there are no changes at this time.    Current  Outpatient Medications on File Prior to Visit  Medication Sig Dispense Refill  . acetaminophen (TYLENOL) 325 MG tablet Take 325-650 mg by mouth every 6 (six) hours as needed for mild pain or headache.    . albuterol (PROVENTIL) (2.5 MG/3ML) 0.083% nebulizer solution Take 2.5 mg by nebulization every 4 (four) hours as needed for wheezing or shortness of breath.     Marland Kitchen. albuterol (VENTOLIN HFA) 108 (90 Base) MCG/ACT inhaler Inhale 2 puffs into the lungs every 6 (six) hours as needed for wheezing or shortness of breath.    Marland Kitchen. amLODipine (NORVASC) 5 MG tablet Take 2.5 mg by mouth daily.     . calcium carbonate (TUMS - DOSED IN MG ELEMENTAL CALCIUM) 500 MG chewable tablet Chew 2 tablets by mouth daily as needed for indigestion or heartburn.    . cholecalciferol (VITAMIN D) 1000 units tablet Take 1,000 Units by mouth daily.    . diclofenac Sodium (VOLTAREN) 1 % GEL Apply topically.    . furosemide (LASIX) 20 MG tablet Take 20 mg by mouth daily as needed.  4  . levothyroxine (SYNTHROID, LEVOTHROID) 75 MCG tablet Take 75 mcg by mouth daily before breakfast.    . Multiple Vitamin (ONE DAILY) tablet Take by mouth.    Marland Kitchen. omeprazole (PRILOSEC) 20 MG capsule Take 20 mg by mouth daily as needed.  2  . polyethylene glycol powder (GLYCOLAX/MIRALAX) 17 GM/SCOOP  powder Take by mouth.    . Saccharomyces boulardii (PROBIOTIC) 250 MG CAPS Take 1 capsule by mouth 3 times/day as needed-between meals & bedtime. 60 capsule 0  . AMITIZA 24 MCG capsule Take 24 mcg by mouth 2 (two) times daily.    Marland Kitchen triamcinolone ointment (KENALOG) 0.1 % Apply topically 2 (two) times daily.     No current facility-administered medications on file prior to visit.    There are no Patient Instructions on file for this visit. No follow-ups on file.   Georgiana Spinner, NP

## 2020-11-03 ENCOUNTER — Other Ambulatory Visit: Payer: Self-pay

## 2020-11-03 ENCOUNTER — Emergency Department
Admission: EM | Admit: 2020-11-03 | Discharge: 2020-11-03 | Disposition: A | Discharge status: Home / Self Care | Payer: Medicare HMO | Attending: Emergency Medicine | Admitting: Emergency Medicine

## 2020-11-03 ENCOUNTER — Encounter: Payer: Self-pay | Admitting: *Deleted

## 2020-11-03 DIAGNOSIS — I10 Essential (primary) hypertension: Secondary | ICD-10-CM | POA: Insufficient documentation

## 2020-11-03 DIAGNOSIS — I2699 Other pulmonary embolism without acute cor pulmonale: Secondary | ICD-10-CM | POA: Insufficient documentation

## 2020-11-03 DIAGNOSIS — J449 Chronic obstructive pulmonary disease, unspecified: Secondary | ICD-10-CM | POA: Insufficient documentation

## 2020-11-03 DIAGNOSIS — N3000 Acute cystitis without hematuria: Secondary | ICD-10-CM | POA: Diagnosis not present

## 2020-11-03 DIAGNOSIS — K921 Melena: Secondary | ICD-10-CM | POA: Insufficient documentation

## 2020-11-03 DIAGNOSIS — Z79899 Other long term (current) drug therapy: Secondary | ICD-10-CM | POA: Diagnosis not present

## 2020-11-03 DIAGNOSIS — J45909 Unspecified asthma, uncomplicated: Secondary | ICD-10-CM | POA: Diagnosis not present

## 2020-11-03 DIAGNOSIS — R102 Pelvic and perineal pain: Secondary | ICD-10-CM

## 2020-11-03 LAB — URINALYSIS, COMPLETE (UACMP) WITH MICROSCOPIC
Bilirubin Urine: NEGATIVE
Glucose, UA: NEGATIVE mg/dL
Ketones, ur: NEGATIVE mg/dL
Nitrite: NEGATIVE
Protein, ur: NEGATIVE mg/dL
Specific Gravity, Urine: 1.014 (ref 1.005–1.030)
WBC, UA: >50 WBC/hpf — ABNORMAL HIGH (ref 0–5)
pH: 5 (ref 5.0–8.0)

## 2020-11-03 LAB — COMPREHENSIVE METABOLIC PANEL
ALT: 12 U/L (ref 0–44)
AST: 20 U/L (ref 15–41)
Albumin: 3.9 g/dL (ref 3.5–5.0)
Alkaline Phosphatase: 62 U/L (ref 38–126)
Anion gap: 13 (ref 5–15)
BUN: 14 mg/dL (ref 8–23)
CO2: 23 mmol/L (ref 22–32)
Calcium: 9.5 mg/dL (ref 8.9–10.3)
Chloride: 103 mmol/L (ref 98–111)
Creatinine, Ser: 0.83 mg/dL (ref 0.44–1.00)
GFR, Estimated: >60 mL/min (ref >60)
Glucose, Bld: 105 mg/dL — ABNORMAL HIGH (ref 70–99)
Potassium: 4 mmol/L (ref 3.5–5.1)
Sodium: 139 mmol/L (ref 135–145)
Total Bilirubin: 0.5 mg/dL (ref 0.3–1.2)
Total Protein: 7.5 g/dL (ref 6.5–8.1)

## 2020-11-03 LAB — LIPASE, BLOOD: Lipase: 26 U/L (ref 11–51)

## 2020-11-03 LAB — CBC
HCT: 36.8 % (ref 36.0–46.0)
Hemoglobin: 11.8 g/dL — ABNORMAL LOW (ref 12.0–15.0)
MCH: 26.9 pg (ref 26.0–34.0)
MCHC: 32.1 g/dL (ref 30.0–36.0)
MCV: 83.8 fL (ref 80.0–100.0)
Platelets: 271 10*3/uL (ref 150–400)
RBC: 4.39 MIL/uL (ref 3.87–5.11)
RDW: 13.9 % (ref 11.5–15.5)
WBC: 6.2 10*3/uL (ref 4.0–10.5)
nRBC: 0 % (ref 0.0–0.2)

## 2020-11-03 MED ORDER — CEPHALEXIN 500 MG PO CAPS: 500.0000 mg | ORAL_CAPSULE | Freq: Three times a day (TID) | ORAL | 0 refills | Status: DC

## 2020-11-03 MED ORDER — CEPHALEXIN 500 MG PO CAPS: 500.0000 mg | ORAL_CAPSULE | Freq: Four times a day (QID) | ORAL | 0 refills | Status: AC

## 2020-11-03 MED ORDER — CEPHALEXIN 500 MG PO CAPS
500.0000 mg | ORAL_CAPSULE | Freq: Once | ORAL | Status: AC
  Administered 2020-11-03: 500 mg via ORAL
  Filled 2020-11-03: qty 1

## 2020-11-03 NOTE — ED Triage Notes (Signed)
Pt is here for abdominal pain and black stools for one week

## 2020-11-03 NOTE — ED Provider Notes (Addendum)
[facility] Emergency Department Provider Note ____________________________________________   Event Date/Time   First MD Initiated Contact with Patient 11/03/20 1512     (approximate)  I have reviewed the triage vital signs and the nursing notes.  HISTORY  Chief Complaint Abdominal Pain   HPI Brooke Perez is a 87 y.o. femalewho presents to the ED for evaluation of abd pain.   Chart review indicates she of HTN, COPD, lymphedema.   Patient presents to the ED with 5 days of worsening suprapubic abdominal pain with associated dysuria.  She denies any fevers, syncope, flank pain, chest pain, emesis or diarrhea.  She reports taking Pepto-Bismol to help assist with this pain, with no relief, but does report an episode of melena this morning.  Denies history of GI bleeding or blood thinners.  Currently reporting 4/10 intensity aching/burning pain to her suprapubic abdomen.  Past Medical History:  Diagnosis Date  . Allergy 2012   ? benzimidazole, IV dye  . Arthritis    since 1997  . Asthma    Per patient.  . Blood in stool   . Breast screening, unspecified   . COPD (chronic obstructive pulmonary disease) (HCC)   . H/O blood clots 2004  . History of cholecystitis 1986  . Hypertension 1997   since 1997  . PE (pulmonary thromboembolism) (HCC)    2004  . Prolapse of intestine   . Special screening for malignant neoplasms, colon     Patient Active Problem List   Diagnosis Date Noted  . Swelling of limb 08/12/2020  . Lymphedema 08/12/2020  . Abnormal ECG 10/19/2018  . Precordial pain 10/19/2018  . COPD with acute exacerbation (HCC) 05/23/2018  . COPD exacerbation (HCC) 05/22/2018  . Chest pain 12/21/2017  . Moderate mitral insufficiency 10/27/2015  . Benign essential HTN 10/20/2015  . Bilateral leg edema 10/20/2015  . Shortness of breath 10/20/2015  . Hydronephrosis of right kidney 10/30/2014  . Incomplete emptying of bladder 10/30/2014  .  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 Dyspnea 10/29/2013  . H/O blood clots     Past Surgical History:  Procedure Laterality Date  . ABDOMINAL HYSTERECTOMY  1968  . broken hand Right 1986  . CHOLECYSTECTOMY  1999   h/o cholecystitis in 1986  . COLON SURGERY  2000  . COLONOSCOPY W/ POLYPECTOMY  12/25/2010   one 69mm semisessile polyp was found in the descending colon  . goiter removal  1965  . TONSILLECTOMY  1954  . TUBAL LIGATION  1968    Prior to Admission medications   Medication Sig Start Date End Date Taking? Authorizing Provider  acetaminophen (TYLENOL) 325 MG tablet Take 325-650 mg by mouth every 6 (six) hours as needed for mild pain or headache.    [provider]  albuterol (PROVENTIL) (2.5 MG/3ML) 0.083% nebulizer solution Take 2.5 mg by nebulization every 4 (four) hours as needed for wheezing or shortness of breath.     [provider]  albuterol (VENTOLIN HFA) 108 (90 Base) MCG/ACT inhaler Inhale 2 puffs into the lungs every 6 (six) hours as needed for wheezing or shortness of breath.    [provider]  AMITIZA 24 MCG capsule Take 24 mcg by mouth 2 (two) times daily. 09/15/20   [provider]  amLODipine (NORVASC) 5 MG tablet Take 2.5 mg by mouth daily.     [provider]  calcium carbonate (TUMS - DOSED IN MG ELEMENTAL CALCIUM) 500 MG chewable tablet Chew  2 tablets by mouth daily as needed for indigestion or heartburn.    [provider]  cephALEXin (KEFLEX) 500 MG capsule Take 1 capsule (500 mg total) by mouth 4 (four) times daily for 7 days. 11/03/20 11/10/20  Delton Prairie, MD  cholecalciferol (VITAMIN D) 1000 units tablet Take 1,000 Units by mouth daily.    [provider]  diclofenac Sodium (VOLTAREN) 1 % GEL Apply topically.    [provider]  furosemide (LASIX) 20 MG tablet Take 20 mg by mouth daily as needed. 08/25/18   [provider]  levothyroxine (SYNTHROID,  LEVOTHROID) 75 MCG tablet Take 75 mcg by mouth daily before breakfast.    [provider]  Multiple Vitamin (ONE DAILY) tablet Take by mouth.    [provider]  omeprazole (PRILOSEC) 20 MG capsule Take 20 mg by mouth daily as needed. 08/25/18   [provider]  polyethylene glycol powder (GLYCOLAX/MIRALAX) 17 GM/SCOOP powder Take by mouth.    [provider]  Saccharomyces boulardii (PROBIOTIC) 250 MG CAPS Take 1 capsule by mouth 3 times/day as needed-between meals & bedtime. 02/22/20   Willy Eddy, MD  triamcinolone ointment (KENALOG) 0.1 % Apply topically 2 (two) times daily. 09/15/20   [provider]    Allergies Camphor-eucalyptus-menthol [liniments], Dextromethorphan hbr, Alprazolam, Amlodipine, Aspirin, Atenolol, Atrovent [ipratropium], Ciprofloxacin, Clonidine derivatives, Codeine, Contrast media [iodinated diagnostic agents], Coumadin [warfarin sodium], Darvon [propoxyphene hcl], Demerol [meperidine], Fish allergy, Flovent [fluticasone], Guiatuss [guaifenesin], Hydrocodone-homatropine, Iohexol, Keflex [cephalexin], Morphine and related, Nitroglycerin, Other, Parafon forte dsc [chlorzoxazone], Phenazopyridine, Plavix [clopidogrel bisulfate], Prednisone (pak) [prednisone], Pyrimethamine, Sertraline, Sulfa antibiotics, Sulfasalazine, and Zithromax [azithromycin]  Family History  Problem Relation Age of Onset  . Emphysema Sister        smoked  . Emphysema Sister        smoked  . Asthma Sister   . Asthma Brother   . Heart disease Mother   . Heart disease Maternal Uncle   . Heart disease Brother     Social History Social History   Tobacco Use  . Smoking status: Never Smoker  . Smokeless tobacco: Never Used  Substance Use Topics  . Alcohol use: No    Comment: never alcoholic  . Drug use: No    Review of Systems  Constitutional: No fever/chills Eyes: No visual changes. ENT: No sore throat. Cardiovascular: Denies chest  pain. Respiratory: Denies shortness of breath. Gastrointestinal: No nausea, no vomiting.  No diarrhea.  No constipation. Positive for suprapubic abdominal pain and dysuria.  Positive for melena after Pepto-Bismol usage. Genitourinary: Negative for dysuria. Musculoskeletal: Negative for back pain. Skin: Negative for rash. Neurological: Negative for headaches, focal weakness or numbness.  ____________________________________________   PHYSICAL EXAM:  VITAL SIGNS: Vitals:   11/03/20 1435 11/03/20 1535  BP: (!) 149/83 (!) 149/83  Pulse: 72 76  Resp:  16  Temp: 97.6 F (36.4 C) 97.8 F (36.6 C)  SpO2: 99% 99%     Constitutional: Alert and oriented. Well appearing and in no acute distress. Eyes: Conjunctivae are normal. PERRL. EOMI. Head: Atraumatic. Nose: No congestion/rhinnorhea. Mouth/Throat: Mucous membranes are moist.  Oropharynx non-erythematous. Neck: No stridor. No cervical spine tenderness to palpation. Cardiovascular: Normal rate, regular rhythm. Grossly normal heart sounds.  Good peripheral circulation. Respiratory: Normal respiratory effort.  No retractions. Lungs CTAB. Gastrointestinal: Soft , nondistended. No CVA tenderness. Positive for suprapubic abdominal tenderness without peritoneal features.  Abdomen otherwise benign.  No CVA tenderness. Musculoskeletal: No lower extremity tenderness nor edema.  No  joint effusions. No signs of acute trauma. Neurologic:  Normal speech and language. No gross focal neurologic deficits are appreciated. No gait instability noted. Skin:  Skin is warm, dry and intact. No rash noted. Psychiatric: Mood and affect are normal. Speech and behavior are normal.  ____________________________________________   LABS (all labs ordered are listed, but only abnormal results are displayed)  Labs Reviewed  COMPREHENSIVE METABOLIC PANEL - Abnormal; Notable for the following components:      Result Value   Glucose, Bld 105 (*)    All other  components within normal limits  CBC - Abnormal; Notable for the following components:   Hemoglobin 11.8 (*)    All other components within normal limits  URINALYSIS, COMPLETE (UACMP) WITH MICROSCOPIC - Abnormal; Notable for the following components:   Color, Urine YELLOW (*)    APPearance CLOUDY (*)    Hgb urine dipstick SMALL (*)    Leukocytes,Ua LARGE (*)    WBC, UA >50 (*)    Bacteria, UA RARE (*)    All other components within normal limits  URINE CULTURE  LIPASE, BLOOD   ____________________________________________  12 Lead EKG  Poor quality EKG demonstrating sinus rhythm, rate of 73 bpm.  Leftward axis.  Incomplete right bundle.  No evidence of acute ischemia. ____________________________________________   PROCEDURES and INTERVENTIONS  Procedure(s) performed (including Critical Care):  .1-3 Lead EKG Interpretation Performed by: Delton Prairie, MD Authorized by: Delton Prairie, MD     Interpretation: normal     ECG rate:  70   ECG rate assessment: normal     Rhythm: sinus rhythm     Ectopy: none     Conduction: normal      Medications  cephALEXin (KEFLEX) capsule 500 mg (has no administration in time range)    ____________________________________________   MDM / ED COURSE   87 year old woman presents to the ED with acute suprapubic pain with dysuria, most consistent with acute cystitis and amenable to outpatient management.  Normal vitals on room air.  Exam with suprapubic tenderness to palpation without peritoneal features, otherwise benign abdomen.  No CVA tenderness or fevers to suggest pyelonephritis, no surgical abdomen or emesis.  Patient looks well overall.  Blood work unremarkable.  Urinalysis with infectious features, and was sent for urine culture.  We will start the patient on Keflex and have her follow-up with PCP.  Of note, patient has greater than 30 documented allergies in her system without any notation of their severity.  Patient has no knowledge of  being allergic to Keflex and so we will therefore provide her with a single dose here in the ED to ensure tolerance prior to discharge.  We discussed return precautions for the ED prior to her leaving. Melena likely due to Pepto-Bismol usage, no evidence of GI bleeding.     ____________________________________________   FINAL CLINICAL IMPRESSION(S) / ED DIAGNOSES  Final diagnoses:  Acute cystitis without hematuria  Suprapubic abdominal pain  Melena     ED Discharge Orders         Ordered    cephALEXin (KEFLEX) 500 MG capsule  3 times daily,   Status:  Discontinued        11/03/20 1523    cephALEXin (KEFLEX) 500 MG capsule  4 times daily        11/03/20 1527           Tali Cleaves   Note:  This document was prepared using Conservation officer, historic buildings and may include unintentional dictation  errors.   Delton Prairie, MD 11/03/20 1540    Delton Prairie, MD 11/03/20 1540

## 2020-11-03 NOTE — Discharge Instructions (Addendum)
You have a UTI, a bladder infection, so you are being discharged with a prescription of Keflex antibiotic to take 4 times daily for the next 7 days.  Please tried to pick up this prescription today from the pharmacy.  We gave you 1 pill you are here, take least 1 more today, and start taking 4 times daily starting tomorrow.  Follow-up with your PCP within 1 week and return to the ED with any worsening symptoms despite these medications.  Use Tylenol for pain and fevers.  Up to 1000 mg per dose, up to 4 times per day.  Do not take more than 4000 mg of Tylenol/acetaminophen within 24 hours.Marland Kitchen

## 2020-11-05 LAB — URINE CULTURE

## 2021-02-26 ENCOUNTER — Emergency Department
Admission: EM | Admit: 2021-02-26 | Discharge: 2021-02-26 | Disposition: A | Discharge status: Home / Self Care | Payer: Medicare HMO | Attending: Emergency Medicine | Admitting: Emergency Medicine

## 2021-02-26 ENCOUNTER — Other Ambulatory Visit: Payer: Self-pay

## 2021-02-26 DIAGNOSIS — Z79899 Other long term (current) drug therapy: Secondary | ICD-10-CM | POA: Insufficient documentation

## 2021-02-26 DIAGNOSIS — L03116 Cellulitis of left lower limb: Secondary | ICD-10-CM | POA: Diagnosis not present

## 2021-02-26 DIAGNOSIS — J441 Chronic obstructive pulmonary disease with (acute) exacerbation: Secondary | ICD-10-CM | POA: Diagnosis not present

## 2021-02-26 DIAGNOSIS — I10 Essential (primary) hypertension: Secondary | ICD-10-CM | POA: Insufficient documentation

## 2021-02-26 DIAGNOSIS — J45909 Unspecified asthma, uncomplicated: Secondary | ICD-10-CM | POA: Diagnosis not present

## 2021-02-26 DIAGNOSIS — R2242 Localized swelling, mass and lump, left lower limb: Secondary | ICD-10-CM | POA: Diagnosis present

## 2021-02-26 LAB — CBC WITH DIFFERENTIAL/PLATELET
Abs Immature Granulocytes: 0.04 10*3/uL (ref 0.00–0.07)
Basophils Absolute: 0.1 10*3/uL (ref 0.0–0.1)
Basophils Relative: 1 %
Eosinophils Absolute: 0.2 10*3/uL (ref 0.0–0.5)
Eosinophils Relative: 2 %
HCT: 38.7 % (ref 36.0–46.0)
Hemoglobin: 12.4 g/dL (ref 12.0–15.0)
Immature Granulocytes: 0 %
Lymphocytes Relative: 17 %
Lymphs Abs: 1.6 10*3/uL (ref 0.7–4.0)
MCH: 26.7 pg (ref 26.0–34.0)
MCHC: 32 g/dL (ref 30.0–36.0)
MCV: 83.2 fL (ref 80.0–100.0)
Monocytes Absolute: 0.7 10*3/uL (ref 0.1–1.0)
Monocytes Relative: 7 %
Neutro Abs: 6.5 10*3/uL (ref 1.7–7.7)
Neutrophils Relative %: 73 %
Platelets: 235 10*3/uL (ref 150–400)
RBC: 4.65 MIL/uL (ref 3.87–5.11)
RDW: 14.5 % (ref 11.5–15.5)
WBC: 8.9 10*3/uL (ref 4.0–10.5)
nRBC: 0 % (ref 0.0–0.2)

## 2021-02-26 LAB — COMPREHENSIVE METABOLIC PANEL
ALT: 12 U/L (ref 0–44)
AST: 18 U/L (ref 15–41)
Albumin: 4.3 g/dL (ref 3.5–5.0)
Alkaline Phosphatase: 75 U/L (ref 38–126)
Anion gap: 10 (ref 5–15)
BUN: 13 mg/dL (ref 8–23)
CO2: 26 mmol/L (ref 22–32)
Calcium: 9.7 mg/dL (ref 8.9–10.3)
Chloride: 101 mmol/L (ref 98–111)
Creatinine, Ser: 0.78 mg/dL (ref 0.44–1.00)
GFR, Estimated: >60 mL/min (ref >60)
Glucose, Bld: 125 mg/dL — ABNORMAL HIGH (ref 70–99)
Potassium: 4.2 mmol/L (ref 3.5–5.1)
Sodium: 137 mmol/L (ref 135–145)
Total Bilirubin: 0.7 mg/dL (ref 0.3–1.2)
Total Protein: 8.1 g/dL (ref 6.5–8.1)

## 2021-02-26 LAB — LACTIC ACID, PLASMA: Lactic Acid, Venous: 1.1 mmol/L (ref 0.5–1.9)

## 2021-02-26 MED ORDER — CLINDAMYCIN PALMITATE HCL 75 MG/5ML PO SOLR: 300.0000 mg | Freq: Three times a day (TID) | ORAL | 0 refills | Status: AC

## 2021-02-26 NOTE — Discharge Instructions (Addendum)
Follow up with primary care in 2-3 days if not improving.  Return to the ER for symptoms that change or worsen if unable to schedule an appointment.Brooke Perez

## 2021-02-26 NOTE — ED Provider Notes (Signed)
[facility] Emergency Department Provider Note  ____________________________________________  Time seen: Approximately 11:54 AM  I have reviewed the triage vital signs and the nursing notes.   HISTORY  Chief Complaint Insect Bite   HPI Brooke Perez is a 87 y.o. female who presents to the emergency department for treatment and evaluation of swelling, redness, and warmth to the left foot and ankle that started about a week ago. She believes it may have started due to a "black ant bite" to which she has had an allergic reaction in the past. She denies fever, nausea, vomiting, or other concerns. She has used Vaseline and caster oil over the area without relief.   Past Medical History:  Diagnosis Date  . Allergy 2012   ? benzimidazole, IV dye  . Arthritis    since 1997  . Asthma    Per patient.  . Blood in stool   . Breast screening, unspecified   . COPD (chronic obstructive pulmonary disease) (HCC)   . H/O blood clots 2004  . History of cholecystitis 1986  . Hypertension 1997   since 1997  . PE (pulmonary thromboembolism) (HCC)    2004  . Prolapse of intestine   . Special screening for malignant neoplasms, colon     Patient Active Problem List   Diagnosis Date Noted  . Swelling of limb 08/12/2020  . Lymphedema 08/12/2020  . Abnormal ECG 10/19/2018  . Precordial pain 10/19/2018  . COPD with acute exacerbation (HCC) 05/23/2018  . COPD exacerbation (HCC) 05/22/2018  . Chest pain 12/21/2017  . Moderate mitral insufficiency 10/27/2015  . Benign essential HTN 10/20/2015  . Bilateral leg edema 10/20/2015  . Shortness of breath 10/20/2015  . Hydronephrosis of right kidney 10/30/2014  . Incomplete emptying of bladder 10/30/2014  . 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 Dyspnea 10/29/2013  . H/O blood clots     Past Surgical History:  Procedure Laterality Date  . ABDOMINAL HYSTERECTOMY  1968  . broken hand Right 1986  .  CHOLECYSTECTOMY  1999   h/o cholecystitis in 1986  . COLON SURGERY  2000  . COLONOSCOPY W/ POLYPECTOMY  12/25/2010   one 21mm semisessile polyp was found in the descending colon  . goiter removal  1965  . TONSILLECTOMY  1954  . TUBAL LIGATION  1968    Prior to Admission medications   Medication Sig Start Date End Date Taking? Authorizing Provider  clindamycin (CLEOCIN) 75 MG/5ML solution Take 20 mLs (300 mg total) by mouth 3 (three) times daily for 10 days. 02/26/21 03/08/21 Yes Terrance Lanahan B, FNP  acetaminophen (TYLENOL) 325 MG tablet Take 325-650 mg by mouth every 6 (six) hours as needed for mild pain or headache.    [provider]  albuterol (PROVENTIL) (2.5 MG/3ML) 0.083% nebulizer solution Take 2.5 mg by nebulization every 4 (four) hours as needed for wheezing or shortness of breath.     [provider]  albuterol (VENTOLIN HFA) 108 (90 Base) MCG/ACT inhaler Inhale 2 puffs into the lungs every 6 (six) hours as needed for wheezing or shortness of breath.    [provider]  AMITIZA 24 MCG capsule Take 24 mcg by mouth 2 (two) times daily. 09/15/20   [provider]  amLODipine (NORVASC) 5 MG tablet Take 2.5 mg by mouth daily.     [provider]  calcium carbonate (TUMS - DOSED IN MG ELEMENTAL CALCIUM) 500 MG chewable tablet Chew 2 tablets by mouth daily as  needed for indigestion or heartburn.    [provider]  cholecalciferol (VITAMIN D) 1000 units tablet Take 1,000 Units by mouth daily.    [provider]  diclofenac Sodium (VOLTAREN) 1 % GEL Apply topically.    [provider]  furosemide (LASIX) 20 MG tablet Take 20 mg by mouth daily as needed. 08/25/18   [provider]  levothyroxine (SYNTHROID, LEVOTHROID) 75 MCG tablet Take 75 mcg by mouth daily before breakfast.    [provider]  Multiple Vitamin (ONE DAILY) tablet Take by mouth.    [provider]  omeprazole (PRILOSEC) 20 MG  capsule Take 20 mg by mouth daily as needed. 08/25/18   [provider]  polyethylene glycol powder (GLYCOLAX/MIRALAX) 17 GM/SCOOP powder Take by mouth.    [provider]  Saccharomyces boulardii (PROBIOTIC) 250 MG CAPS Take 1 capsule by mouth 3 times/day as needed-between meals & bedtime. 02/22/20   Willy Eddy, MD  triamcinolone ointment (KENALOG) 0.1 % Apply topically 2 (two) times daily. 09/15/20   [provider]    Allergies Camphor-eucalyptus-menthol [liniments], Dextromethorphan hbr, Alprazolam, Amlodipine, Aspirin, Atenolol, Atrovent [ipratropium], Ciprofloxacin, Clonidine derivatives, Codeine, Contrast media [iodinated diagnostic agents], Coumadin [warfarin sodium], Darvon [propoxyphene hcl], Demerol [meperidine], Fish allergy, Flovent [fluticasone], Guiatuss [guaifenesin], Hydrocodone bit-homatrop mbr, Iohexol, Keflex [cephalexin], Morphine and related, Nitroglycerin, Other, Parafon forte dsc [chlorzoxazone], Phenazopyridine, Plavix [clopidogrel bisulfate], Prednisone (pak) [prednisone], Pyrimethamine, Sertraline, Sulfa antibiotics, Sulfasalazine, and Zithromax [azithromycin]  Family History  Problem Relation Age of Onset  . Emphysema Sister        smoked  . Emphysema Sister        smoked  . Asthma Sister   . Asthma Brother   . Heart disease Mother   . Heart disease Maternal Uncle   . Heart disease Brother     Social History Social History   Tobacco Use  . Smoking status: Never Smoker  . Smokeless tobacco: Never Used  Substance Use Topics  . Alcohol use: No    Comment: never alcoholic  . Drug use: No    Review of Systems  Constitutional: Negative for fever. Respiratory: Negative for cough or shortness of breath.  Musculoskeletal: Negative for myalgias Skin: Positive for redness and erythema of left foot and ankle. Neurological: Negative for numbness or paresthesias. ____________________________________________   PHYSICAL  EXAM:  VITAL SIGNS: ED Triage Vitals  Enc Vitals Group     BP 02/26/21 1136 (!) 172/92     Pulse Rate 02/26/21 1131 67     Resp 02/26/21 1136 12     Temp 02/26/21 1131 98.4 F (36.9 C)     Temp Source 02/26/21 1131 Oral     SpO2 02/26/21 1131 98 %     Weight 02/26/21 1136 135 lb (61.2 kg)     Height 02/26/21 1136 5\' 6"  (1.676 m)     Head Circumference --      Peak Flow --      Pain Score 02/26/21 1136 1     Pain Loc --      Pain Edu? --      Excl. in GC? --      Constitutional: Overall well appearing. Eyes: Conjunctivae are clear without discharge or drainage. Nose: No rhinorrhea noted. Mouth/Throat: Airway is patent.  Neck: No stridor. Unrestricted range of motion observed. Cardiovascular: Capillary refill is <3 seconds.  Respiratory: Respirations are even and unlabored.. Musculoskeletal: Unrestricted range of motion observed. DP and PT pulses palpable. Neurologic: Awake, alert, and oriented  x 4.  Skin:  Erythema overlying the left foot and ankle on the left side without area of induration or fluctuance.   ____________________________________________   LABS (all labs ordered are listed, but only abnormal results are displayed)  Labs Reviewed  COMPREHENSIVE METABOLIC PANEL - Abnormal; Notable for the following components:      Result Value   Glucose, Bld 125 (*)    All other components within normal limits  LACTIC ACID, PLASMA  CBC WITH DIFFERENTIAL/PLATELET   ____________________________________________  EKG  Not indicated. ____________________________________________  RADIOLOGY  Not indicated. ____________________________________________   PROCEDURES  Procedures ____________________________________________   INITIAL IMPRESSION / ASSESSMENT AND PLAN / ED COURSE  Brooke Perez is a 87 y.o. female presenting to the emergency department for treatment and evaluation of erythema, tenderness, and increased skin temperature of the left ankle and foot  that has been present for approximately 1 week.  Patient denies recent injury.  She states that this has happened in the past when she has been bitten by black ant.  Patient states that she is allergic to the majority of the antibiotics and due to to esophagitis will need a liquid antibiotic if prescribed.   Lab studies are overall reassuring.  She has a normal white blood cell count, lactic acid is normal, CMP is normal and vital signs do not indicate sepsis.  Exam does show erythema and mild edema overlying the left ankle and foot consistent with cellulitis.  Review of her medication allergies limits agents that would be useful in treatment therefore clindamycin was chosen.  Patient was advised to follow-up with her primary care provider in 2 to 3 days, especially if she has not seen any improvement in the redness or swelling.  She was advised to return to the emergency department for symptoms of change or worsen if she is unable to schedule an appointment.   Medications - No data to display   Pertinent labs & imaging results that were available during my care of the patient were reviewed by me and considered in my medical decision making (see chart for details).  ____________________________________________   FINAL CLINICAL IMPRESSION(S) / ED DIAGNOSES  Final diagnoses:  Cellulitis of left lower extremity    ED Discharge Orders         Ordered    clindamycin (CLEOCIN) 75 MG/5ML solution  3 times daily        02/26/21 1217           Note:  This document was prepared using Dragon voice recognition software and may include unintentional dictation errors.   Chinita Pester, FNP 02/26/21 1227    Minna Antis, MD 02/26/21 1344

## 2021-02-26 NOTE — ED Triage Notes (Addendum)
See first nurse note- pt to ER with possible insect bite to left ankle. Swelling, redness, and warmth present from foot to ankle. Pt denies any fevers at home. Reports pain and itching at site. States she has been icing the area and applying vaseline and neosporin.

## 2021-02-26 NOTE — ED Triage Notes (Signed)
Pt comes into the ED via [facility] from home c/o possible insect bite by a black ant which she states she is allergic to.  Pt states she was bit on the left foot and now has swelling and redness around the left ankle.  Pt has good pedal pulses per EMS and she is able to move her toes.  Pt in NAD at this time with even and unlabored respirations.

## 2021-03-17 ENCOUNTER — Ambulatory Visit (INDEPENDENT_AMBULATORY_CARE_PROVIDER_SITE_OTHER): Payer: Medicare HMO | Admitting: Nurse Practitioner

## 2022-08-02 ENCOUNTER — Encounter (INDEPENDENT_AMBULATORY_CARE_PROVIDER_SITE_OTHER): Payer: Self-pay

## 2024-03-04 DEATH — deceased
